# Patient Record
Sex: Male | Born: 1950 | Race: White | Hispanic: No | Marital: Married | Smoking: Former smoker
Health system: Southern US, Community
[De-identification: ages and names within clinical notes are randomized; demographics above are authoritative.]

## PROBLEM LIST (undated history)

## (undated) DIAGNOSIS — N400 Enlarged prostate without lower urinary tract symptoms: Secondary | ICD-10-CM

## (undated) HISTORY — PX: PROSTATE BIOPSY: SHX241

## (undated) HISTORY — DX: Benign prostatic hyperplasia without lower urinary tract symptoms: N40.0

---

## 2005-05-22 DIAGNOSIS — N400 Enlarged prostate without lower urinary tract symptoms: Secondary | ICD-10-CM

## 2005-05-22 HISTORY — DX: Benign prostatic hyperplasia without lower urinary tract symptoms: N40.0

## 2005-05-30 ENCOUNTER — Ambulatory Visit: Payer: Self-pay | Admitting: Internal Medicine

## 2005-06-06 ENCOUNTER — Ambulatory Visit: Payer: Self-pay | Admitting: Internal Medicine

## 2006-07-11 ENCOUNTER — Ambulatory Visit: Payer: Self-pay | Admitting: Internal Medicine

## 2006-07-11 LAB — CONVERTED CEMR LAB
BUN: 13 mg/dL (ref 6–23)
CO2: 30 meq/L (ref 19–32)
Calcium: 9.2 mg/dL (ref 8.4–10.5)
Chloride: 108 meq/L (ref 96–112)
Creatinine, Ser: 0.9 mg/dL (ref 0.4–1.5)
GFR calc Af Amer: 112 mL/min
GFR calc non Af Amer: 93 mL/min
Glucose, Bld: 123 mg/dL — ABNORMAL HIGH (ref 70–99)
Hgb A1c MFr Bld: 5.5 % (ref 4.6–6.0)
Potassium: 4.7 meq/L (ref 3.5–5.1)
Sodium: 142 meq/L (ref 135–145)

## 2006-08-27 ENCOUNTER — Ambulatory Visit: Payer: Self-pay | Admitting: Internal Medicine

## 2006-08-27 LAB — CONVERTED CEMR LAB
PSA, Free Pct: 15 — ABNORMAL LOW (ref >25)
PSA, Free: 0.7 ng/mL
PSA: 4.28 ng/mL — ABNORMAL HIGH (ref 0.10–4.00)
PSA: 4.6 ng/mL — ABNORMAL HIGH (ref 0.10–4.00)

## 2007-01-31 ENCOUNTER — Ambulatory Visit: Payer: Self-pay | Admitting: Internal Medicine

## 2007-02-04 ENCOUNTER — Ambulatory Visit: Payer: Self-pay | Admitting: Internal Medicine

## 2007-05-06 ENCOUNTER — Encounter: Payer: Self-pay | Admitting: Internal Medicine

## 2007-05-21 ENCOUNTER — Telehealth: Payer: Self-pay | Admitting: Internal Medicine

## 2007-06-11 ENCOUNTER — Telehealth: Payer: Self-pay | Admitting: Internal Medicine

## 2007-06-11 ENCOUNTER — Ambulatory Visit: Payer: Self-pay | Admitting: Internal Medicine

## 2007-06-20 LAB — CONVERTED CEMR LAB
ALT: 43 units/L (ref 0–53)
Bilirubin, Direct: 0.2 mg/dL (ref 0.0–0.3)
Calcium: 9.4 mg/dL (ref 8.4–10.5)
Cholesterol: 185 mg/dL (ref 0–200)
Eosinophils Absolute: 0.1 10*3/uL (ref 0.0–0.6)
Eosinophils Relative: 1.8 % (ref 0.0–5.0)
GFR calc Af Amer: 89 mL/min
GFR calc non Af Amer: 73 mL/min
Glucose, Bld: 136 mg/dL — ABNORMAL HIGH (ref 70–99)
Lymphocytes Relative: 30.6 % (ref 12.0–46.0)
MCV: 88.4 fL (ref 78.0–100.0)
Monocytes Relative: 8.3 % (ref 3.0–11.0)
Neutro Abs: 2.4 10*3/uL (ref 1.4–7.7)
Platelets: 169 10*3/uL (ref 150–400)
Potassium: 4.4 meq/L (ref 3.5–5.1)
Sodium: 141 meq/L (ref 135–145)
TSH: 1.26 microintl units/mL (ref 0.35–5.50)
Total CHOL/HDL Ratio: 5.9
Triglycerides: 143 mg/dL (ref 0–149)
Urine Glucose: NEGATIVE mg/dL
WBC: 4 10*3/uL — ABNORMAL LOW (ref 4.5–10.5)

## 2008-04-07 ENCOUNTER — Ambulatory Visit: Payer: Self-pay | Admitting: Internal Medicine

## 2008-04-07 LAB — CONVERTED CEMR LAB
ALT: 17 units/L (ref 0–53)
AST: 19 units/L (ref 0–37)
Albumin: 3.9 g/dL (ref 3.5–5.2)
Alkaline Phosphatase: 54 units/L (ref 39–117)
BUN: 9 mg/dL (ref 6–23)
Bilirubin Urine: NEGATIVE
CO2: 31 meq/L (ref 19–32)
Chloride: 107 meq/L (ref 96–112)
Cholesterol: 133 mg/dL (ref 0–200)
Creatinine, Ser: 0.9 mg/dL (ref 0.4–1.5)
HDL: 40 mg/dL (ref >39.0)
Hgb A1c MFr Bld: 4.9 % (ref 4.6–6.0)
Ketones, ur: NEGATIVE mg/dL
Potassium: 4.5 meq/L (ref 3.5–5.1)
Total Protein, Urine: NEGATIVE mg/dL
Triglycerides: 52 mg/dL (ref 0–149)
Urine Glucose: NEGATIVE mg/dL
pH: 6.5 (ref 5.0–8.0)

## 2008-04-14 ENCOUNTER — Ambulatory Visit: Payer: Self-pay | Admitting: Internal Medicine

## 2008-04-14 DIAGNOSIS — R209 Unspecified disturbances of skin sensation: Secondary | ICD-10-CM | POA: Insufficient documentation

## 2008-04-14 DIAGNOSIS — N400 Enlarged prostate without lower urinary tract symptoms: Secondary | ICD-10-CM | POA: Insufficient documentation

## 2009-07-13 ENCOUNTER — Telehealth (INDEPENDENT_AMBULATORY_CARE_PROVIDER_SITE_OTHER): Payer: Self-pay | Admitting: *Deleted

## 2009-07-15 ENCOUNTER — Ambulatory Visit: Payer: Self-pay | Admitting: Internal Medicine

## 2009-07-16 LAB — CONVERTED CEMR LAB
ALT: 13 units/L (ref 0–53)
AST: 16 units/L (ref 0–37)
Alkaline Phosphatase: 76 units/L (ref 39–117)
BUN: 8 mg/dL (ref 6–23)
Basophils Relative: 1 % (ref 0.0–3.0)
Bilirubin Urine: NEGATIVE
Bilirubin, Direct: 0.2 mg/dL (ref 0.0–0.3)
Chloride: 105 meq/L (ref 96–112)
Creatinine, Ser: 0.8 mg/dL (ref 0.4–1.5)
Eosinophils Relative: 1.1 % (ref 0.0–5.0)
GFR calc non Af Amer: 105.11 mL/min (ref >60)
Ketones, ur: NEGATIVE mg/dL
LDL Cholesterol: 86 mg/dL (ref 0–99)
Lymphocytes Relative: 23.2 % (ref 12.0–46.0)
MCV: 91.1 fL (ref 78.0–100.0)
Monocytes Absolute: 0.4 10*3/uL (ref 0.1–1.0)
Monocytes Relative: 7.3 % (ref 3.0–12.0)
Neutrophils Relative %: 67.4 % (ref 43.0–77.0)
Nitrite: NEGATIVE
PSA: 6.33 ng/mL — ABNORMAL HIGH (ref 0.10–4.00)
Platelets: 188 10*3/uL (ref 150.0–400.0)
RBC: 4.51 M/uL (ref 4.22–5.81)
Specific Gravity, Urine: 1.01 (ref 1.000–1.030)
Total Bilirubin: 0.7 mg/dL (ref 0.3–1.2)
Total CHOL/HDL Ratio: 3
Total Protein, Urine: NEGATIVE mg/dL
Total Protein: 7.3 g/dL (ref 6.0–8.3)
Triglycerides: 62 mg/dL (ref 0.0–149.0)
Urine Glucose: NEGATIVE mg/dL
Urobilinogen, UA: 0.2 (ref 0.0–1.0)
VLDL: 12.4 mg/dL (ref 0.0–40.0)
Vit D, 25-Hydroxy: 15 ng/mL — ABNORMAL LOW (ref 30–89)
WBC: 5.4 10*3/uL (ref 4.5–10.5)
pH: 7.5 (ref 5.0–8.0)

## 2009-07-20 ENCOUNTER — Ambulatory Visit: Payer: Self-pay | Admitting: Internal Medicine

## 2009-07-20 DIAGNOSIS — R972 Elevated prostate specific antigen [PSA]: Secondary | ICD-10-CM

## 2009-07-20 DIAGNOSIS — Z87891 Personal history of nicotine dependence: Secondary | ICD-10-CM

## 2009-07-20 DIAGNOSIS — E559 Vitamin D deficiency, unspecified: Secondary | ICD-10-CM | POA: Insufficient documentation

## 2009-11-15 ENCOUNTER — Ambulatory Visit: Payer: Self-pay | Admitting: Internal Medicine

## 2009-11-15 LAB — CONVERTED CEMR LAB
CO2: 30 meq/L (ref 19–32)
Calcium: 8.9 mg/dL (ref 8.4–10.5)
Creatinine, Ser: 0.9 mg/dL (ref 0.4–1.5)
Hgb A1c MFr Bld: 5.2 % (ref 4.6–6.5)

## 2009-11-16 LAB — CONVERTED CEMR LAB: PSA: 5.56 ng/mL — ABNORMAL HIGH (ref 0.10–4.00)

## 2009-11-17 ENCOUNTER — Encounter: Payer: Self-pay | Admitting: Internal Medicine

## 2009-11-23 ENCOUNTER — Ambulatory Visit: Payer: Self-pay | Admitting: Internal Medicine

## 2009-11-23 DIAGNOSIS — M542 Cervicalgia: Secondary | ICD-10-CM | POA: Insufficient documentation

## 2010-05-27 ENCOUNTER — Ambulatory Visit
Admission: RE | Admit: 2010-05-27 | Discharge: 2010-05-27 | Payer: Self-pay | Source: Home / Self Care | Attending: Internal Medicine | Admitting: Internal Medicine

## 2010-05-27 ENCOUNTER — Encounter: Payer: Self-pay | Admitting: Internal Medicine

## 2010-05-27 ENCOUNTER — Other Ambulatory Visit: Payer: Self-pay | Admitting: Internal Medicine

## 2010-05-27 LAB — URINALYSIS
Bilirubin Urine: NEGATIVE
Hemoglobin, Urine: NEGATIVE
Ketones, ur: NEGATIVE
Leukocytes, UA: NEGATIVE
Nitrite: NEGATIVE
Specific Gravity, Urine: 1.015 (ref 1.000–1.030)
Urine Glucose: NEGATIVE
Urobilinogen, UA: 1 (ref 0.0–1.0)
pH: 7 (ref 5.0–8.0)

## 2010-05-27 LAB — CBC WITH DIFFERENTIAL/PLATELET
Basophils Absolute: 0 10*3/uL (ref 0.0–0.1)
Basophils Relative: 0.4 % (ref 0.0–3.0)
Eosinophils Absolute: 0.1 10*3/uL (ref 0.0–0.7)
Eosinophils Relative: 1.2 % (ref 0.0–5.0)
HCT: 43.6 % (ref 39.0–52.0)
Hemoglobin: 15 g/dL (ref 13.0–17.0)
Lymphocytes Relative: 26 % (ref 12.0–46.0)
Lymphs Abs: 1.4 10*3/uL (ref 0.7–4.0)
MCHC: 34.4 g/dL (ref 30.0–36.0)
MCV: 87.5 fl (ref 78.0–100.0)
Monocytes Absolute: 0.4 10*3/uL (ref 0.1–1.0)
Monocytes Relative: 7.4 % (ref 3.0–12.0)
Neutro Abs: 3.6 10*3/uL (ref 1.4–7.7)
Neutrophils Relative %: 65 % (ref 43.0–77.0)
Platelets: 187 10*3/uL (ref 150.0–400.0)
RBC: 4.98 Mil/uL (ref 4.22–5.81)
RDW: 14 % (ref 11.5–14.6)
WBC: 5.5 10*3/uL (ref 4.5–10.5)

## 2010-05-27 LAB — HEPATIC FUNCTION PANEL
ALT: 19 U/L (ref 0–53)
AST: 21 U/L (ref 0–37)
Albumin: 3.9 g/dL (ref 3.5–5.2)
Alkaline Phosphatase: 87 U/L (ref 39–117)
Bilirubin, Direct: 0.1 mg/dL (ref 0.0–0.3)
Total Bilirubin: 0.8 mg/dL (ref 0.3–1.2)
Total Protein: 6.8 g/dL (ref 6.0–8.3)

## 2010-05-27 LAB — BASIC METABOLIC PANEL
BUN: 12 mg/dL (ref 6–23)
CO2: 29 mEq/L (ref 19–32)
Calcium: 9.1 mg/dL (ref 8.4–10.5)
Chloride: 103 mEq/L (ref 96–112)
Creatinine, Ser: 0.8 mg/dL (ref 0.4–1.5)
GFR: 101.86 mL/min (ref >60.00)
Glucose, Bld: 99 mg/dL (ref 70–99)
Potassium: 4.2 mEq/L (ref 3.5–5.1)
Sodium: 140 mEq/L (ref 135–145)

## 2010-05-27 LAB — LIPID PANEL
Cholesterol: 199 mg/dL (ref 0–200)
HDL: 45.3 mg/dL (ref >39.00)
LDL Cholesterol: 139 mg/dL — ABNORMAL HIGH (ref 0–99)
Total CHOL/HDL Ratio: 4
Triglycerides: 73 mg/dL (ref 0.0–149.0)
VLDL: 14.6 mg/dL (ref 0.0–40.0)

## 2010-05-27 LAB — PSA: PSA: 7.03 ng/mL — ABNORMAL HIGH (ref 0.10–4.00)

## 2010-05-27 LAB — TSH: TSH: 0.88 u[IU]/mL (ref 0.35–5.50)

## 2010-05-28 LAB — CONVERTED CEMR LAB
PSA, Free: 0.9 ng/mL
PSA: 6.96 ng/mL — ABNORMAL HIGH (ref <=4.00)

## 2010-05-31 ENCOUNTER — Ambulatory Visit
Admission: RE | Admit: 2010-05-31 | Discharge: 2010-05-31 | Payer: Self-pay | Source: Home / Self Care | Attending: Internal Medicine | Admitting: Internal Medicine

## 2010-06-21 NOTE — Letter (Signed)
Summary: Results Follow-up Letter  [facility]  [address] [place], [place] [address]   Phone: [phone]  Fax: [phone]    11/17/2009  [address] [place], [place]  [address]  Dear Mr. Abrahamsen,   The following are the results of your recent test(s):  Test     Result     Prostate     high level Vitamin D      low  _________________________________________________________  Please call for an appointment soon _________________________________________________________ _________________________________________________________ _________________________________________________________  Sincerely,  Sanda Linger MD [facility]

## 2010-06-21 NOTE — Assessment & Plan Note (Signed)
Summary: 4 mos f/u // # / cd   Vital Signs:  Patient profile:   60 year old male Height:      69 inches (175.26 cm) Weight:      216 pounds (98.18 kg) BMI:     32.01 Temp:     98.6 degrees F (37.00 degrees C) oral Pulse rate:   68 / minute Resp:     16 per minute BP sitting:   120 / 86  (left arm) Cuff size:   regular  Vitals Entered By: Lanier Prude, CMA(AAMA) (November 23, 2009 7:50 AM) CC: 4 mo f/u Comments pt is not taking ibuprofen    CC:  4 mo f/u.  History of Present Illness: F/u labs, elev PSA, BPH C/o neck pain x wks He had an MRI neck and LS with a relative of his a few months ago - both WNL  Current Medications (verified): 1)  Vitamin D 1000 Unit Tabs (Cholecalciferol) .Marland Kitchen.. 1 By Mouth Qd 2)  Ibuprofen 600 Mg Tabs (Ibuprofen) .Marland Kitchen.. 1 By Mouth Bid  Pc X 1 Wk Then As Needed For  Pain 3)  Vitamin D (Ergocalciferol) 50000 Unit Caps (Ergocalciferol) .Marland Kitchen.. 1 By Mouth Q 1 Week X 6 Weeks Then Start Vit D 1000 International Units Qd 4)  Proscar 5 Mg Tabs (Finasteride) .Marland Kitchen.. 1 By Mouth Once Daily For Prostate  Allergies (verified): No Known Drug Allergies  Past History:  Past Medical History: Last updated: July 22, 2009 Benign prostatic hypertrophy with elev PSA 2007, s/p bx Dr Hazeline Junker D def  Past Surgical History: Last updated: 22-Jul-2009 prostate biopsy Dr Vernie Ammons  Family History: Last updated: 07/22/2009 DM bone cancer M F died at 35  Social History: Last updated: 04/14/2008 Occupation: professor Married Former Smoker Regular exercise-yes  Review of Systems  The patient denies fever, chest pain, dyspnea on exertion, abdominal pain, suspicious skin lesions, difficulty walking, and depression.    Physical Exam  General:  Well-developed,well-nourished,in no acute distress; alert,appropriate and cooperative throughout examination Nose:  External nasal examination shows no deformity or inflammation. Nasal mucosa are pink and moist without lesions or  exudates. Mouth:  Oral mucosa and oropharynx without lesions or exudates.  Teeth in good repair. Lungs:  Normal respiratory effort, chest expands symmetrically. Lungs are clear to auscultation, no crackles or wheezes. Heart:  Normal rate and regular rhythm. S1 and S2 normal without gallop, murmur, click, rub or other extra sounds.Huston Foley. Abdomen:  Bowel sounds positive,abdomen soft and non-tender without masses, organomegaly or hernias noted. Prostate:  Declined Msk:  No deformity or scoliosis noted of thoracic or lumbar spine.   Extremities:  No clubbing, cyanosis, edema, or deformity noted with normal full range of motion of all joints.   Neurologic:  No cranial nerve deficits noted. Station and gait are normal. Plantar reflexes are down-going bilaterally. DTRs are symmetrical throughout. Sensory, motor and coordinative functions appear intact. Skin:  Intact without suspicious lesions or rashes Cracked fingertips Psych:  Cognition and judgment appear intact. Alert and cooperative with normal attention span and concentration. No apparent delusions, illusions, hallucinations   Impression & Recommendations:  Problem # 1:  PSA, INCREASED (ICD-790.93) Assessment New We discussed the issue wit PSA/free PSA He refused exam and Urol f/u ref today. He would rather have me recheck PSA in 6 months  The labs were reviewed with the patient.   Problem # 2:  VITAMIN D DEFICIENCY (ICD-268.9) Assessment: Improved Increase dose Vit D The labs were reviewed with the patient.  Problem # 3:  BENIGN PROSTATIC HYPERTROPHY (ICD-600.00) Assessment: Improved  His updated medication list for this problem includes:    Proscar 5 Mg Tabs (Finasteride) .Marland Kitchen... 1 by mouth once daily for prostate  Problem # 4:  NECK PAIN (ICD-723.1) MSK Assessment: New See "Patient Instructions".  His updated medication list for this problem includes:    Ibuprofen 600 Mg Tabs (Ibuprofen) .Marland Kitchen... 1 by mouth bid  pc x 1 wk then as  needed for  pain    Aspirin 81 Mg Tbec (Aspirin) .Marland Kitchen... 1 by mouth qd  Complete Medication List: 1)  Vitamin D 1000 Unit Tabs (Cholecalciferol) .... 2 by mouth qd 2)  Ibuprofen 600 Mg Tabs (Ibuprofen) .Marland Kitchen.. 1 by mouth bid  pc x 1 wk then as needed for  pain 3)  Proscar 5 Mg Tabs (Finasteride) .Marland Kitchen.. 1 by mouth once daily for prostate 4)  Aspirin 81 Mg Tbec (Aspirin) .Marland Kitchen.. 1 by mouth qd  Patient Instructions: 1)  Please schedule a follow-up appointment in 6 months well w/labs w/PSA and free PSA v70.0 596.0 Vit D 268.9. 2)  Start taking a  yoga class  3)  Get a countour pillow

## 2010-06-21 NOTE — Assessment & Plan Note (Signed)
Summary: CPX LABS / [employer] / # / CD   Vital Signs:  Patient profile:   60 year old male Height:      69 inches Weight:      215 pounds BMI:     31.86 Temp:     97.5 degrees F oral Pulse rate:   61 / minute BP sitting:   110 / 62  (left arm)  Vitals Entered By: Tora Perches (July 20, 2009 2:20 PM) CC: cpx Is Patient Diabetic? No   CC:  cpx.  History of Present Illness: The patient presents for a wellness examination   Preventive Screening-Counseling & Management  Alcohol-Tobacco     Smoking Status: quit  Current Medications (verified): 1)  None  Allergies (verified): No Known Drug Allergies  Past History:  Social History: Last updated: 04/14/2008 Occupation: professor Married Former Smoker Regular exercise-yes  Past Medical History: Benign prostatic hypertrophy with elev PSA 2007, s/p bx Dr Vernie Ammons Vit D def  Past Surgical History: prostate biopsy Dr Vernie Ammons  Family History: DM bone cancer M F died at 94  Review of Systems  The patient denies anorexia, fever, weight loss, weight gain, vision loss, decreased hearing, hoarseness, chest pain, syncope, dyspnea on exertion, peripheral edema, prolonged cough, headaches, hemoptysis, abdominal pain, melena, hematochezia, severe indigestion/heartburn, hematuria, incontinence, genital sores, muscle weakness, suspicious skin lesions, transient blindness, difficulty walking, depression, unusual weight change, abnormal bleeding, enlarged lymph nodes, angioedema, and testicular masses.    Physical Exam  General:  Well-developed,well-nourished,in no acute distress; alert,appropriate and cooperative throughout examination Eyes:  No corneal or conjunctival inflammation noted. EOMI. Perrla. Funduscopic exam benign, without hemorrhages, exudates or papilledema. Vision grossly normal. Ears:  External ear exam shows no significant lesions or deformities.  Otoscopic examination reveals clear canals, tympanic membranes are  intact bilaterally without bulging, retraction, inflammation or discharge. Hearing is grossly normal bilaterally. Nose:  External nasal examination shows no deformity or inflammation. Nasal mucosa are pink and moist without lesions or exudates. Mouth:  Oral mucosa and oropharynx without lesions or exudates.  Teeth in good repair. Lungs:  Normal respiratory effort, chest expands symmetrically. Lungs are clear to auscultation, no crackles or wheezes. Heart:  Normal rate and regular rhythm. S1 and S2 normal without gallop, murmur, click, rub or other extra sounds.Huston Foley. Abdomen:  Bowel sounds positive,abdomen soft and non-tender without masses, organomegaly or hernias noted. Rectal:  external hemorrhoid(s).   Genitalia:  Testes bilaterally descended without nodularity, tenderness or masses. No scrotal masses or lesions. No penis lesions or urethral discharge. Prostate:  no nodules and 1+ enlarged.   Msk:  No deformity or scoliosis noted of thoracic or lumbar spine.   Pulses:  R and L carotid,radial,femoral,dorsalis pedis and posterior tibial pulses are full and equal bilaterally Extremities:  No clubbing, cyanosis, edema, or deformity noted with normal full range of motion of all joints.   Neurologic:  No cranial nerve deficits noted. Station and gait are normal. Plantar reflexes are down-going bilaterally. DTRs are symmetrical throughout. Sensory, motor and coordinative functions appear intact. Skin:  Intact without suspicious lesions or rashes Cracked fingertips Inguinal Nodes:  No significant adenopathy Psych:  Cognition and judgment appear intact. Alert and cooperative with normal attention span and concentration. No apparent delusions, illusions, hallucinations   Impression & Recommendations:  Problem # 1:  ROUTINE GENERAL MEDICAL EXAM@HEALTH  CARE FACL (ICD-V70.0) Assessment New The labs were reviewed with the patient.  Health and age related issues were discussed. Available screening tests  and vaccinations were  discussed as well. Healthy life style including good diet and execise was discussed. Declined colon Orders: EKG w/ Interpretation (93000)  Problem # 2:  PSA, INCREASED (ICD-790.93) Assessment: Deteriorated Options to monitor discussed. Declined Urol f/u. See "Patient Instructions".   Complete Medication List: 1)  Vitamin D 1000 Unit Tabs (Cholecalciferol) .Marland Kitchen.. 1 by mouth qd 2)  Ibuprofen 600 Mg Tabs (Ibuprofen) .Marland Kitchen.. 1 by mouth bid  pc x 1 wk then as needed for  pain 3)  Vitamin D (ergocalciferol) 50000 Unit Caps (Ergocalciferol) .Marland Kitchen.. 1 by mouth q 1 week x 6 weeks then start vit d 1000 international units qd 4)  Proscar 5 Mg Tabs (Finasteride) .Marland Kitchen.. 1 by mouth once daily for prostate  Contraindications/Deferment of Procedures/Staging:    Test/Procedure: Colonoscopy    Reason for deferment: patient declined     Test/Procedure: FLU VAX    Reason for deferment: patient declined   Patient Instructions: 1)  Please schedule a follow-up appointment in 4 months. 2)  BMP prior to visit, ICD-9: 3)  PSA and free PSA prior to visit, ICD-9: 596 4)  Vit D 268.9 5)  HbgA1C prior to visit, ICD-9:790.29 65Prescriptions: PROSCAR 5 MG TABS (FINASTERIDE) 1 by mouth once daily for prostate  #90 x 3   Entered and Authorized by:   Tresa Garter MD   Signed by:   Tresa Garter MD on 07/20/2009   Method used:   Print then Give to Patient   RxID:   [id number] VITAMIN D (ERGOCALCIFEROL) 50000 UNIT CAPS (ERGOCALCIFEROL) 1 by mouth q 1 week x 6 weeks then start Vit D 1000 international units qd  #6 x 0   Entered and Authorized by:   Tresa Garter MD   Signed by:   Tresa Garter MD on 07/20/2009   Method used:   Print then Give to Patient   RxID:   [id number] IBUPROFEN 600 MG TABS (IBUPROFEN) 1 by mouth bid  pc x 1 wk then as needed for  pain  #60 x 3   Entered and Authorized by:   Tresa Garter MD   Signed by:   Tresa Garter MD on  07/20/2009   Method used:   Print then Give to Patient   RxID:   [id number]

## 2010-06-21 NOTE — Progress Notes (Signed)
Summary: Vit D  Phone Note Call from Patient Call back at Home Phone [phone]   Caller: Patient Call For: Tresa Garter MD Summary of Call: Pt called and sched CPX for 3/2. PT is requesting vit d added to CPX labs. Please advise. Initial call taken by: Verdell Face,  July 13, 2009 1:52 PM  Follow-up for Phone Call        OK to add Follow-up by: Tresa Garter MD,  July 14, 2009 12:55 PM  Additional Follow-up for Phone Call Additional follow up Details #1::        Added Vit D to lab order for pt. Additional Follow-up by: Verdell Face,  July 14, 2009 2:02 PM

## 2010-06-23 NOTE — Assessment & Plan Note (Signed)
Summary: 6 MTH FU  STC   Vital Signs:  Patient profile:   60 year old male Height:      69 inches Weight:      220 pounds BMI:     32.61 Temp:     98.5 degrees F oral Pulse rate:   72 / minute Pulse rhythm:   regular Resp:     16 per minute BP sitting:   130 / 92  (left arm) Cuff size:   regular  Vitals Entered By: Lanier Prude, CMA(AAMA) (May 31, 2010 8:05 AM) CC: 6 mo f/u  Is Patient Diabetic? No Comments pt is not taking Ibuprofen, Proscar or Aspirin   CC:  6 mo f/u .  History of Present Illness: The patient presents for a preventive health examination  F/u elev PSA. He stopped Proscar after 6 months of use C/o neck pain  x 23mo worse - he had an MRI neck/LS 1 y ago in [place] --OA  Current Medications (verified): 1)  Vitamin D 1000 Unit Tabs (Cholecalciferol) .... 2 By Mouth Qd 2)  Ibuprofen 600 Mg Tabs (Ibuprofen) .Marland Kitchen.. 1 By Mouth Bid  Pc X 1 Wk Then As Needed For  Pain 3)  Proscar 5 Mg Tabs (Finasteride) .Marland Kitchen.. 1 By Mouth Once Daily For Prostate 4)  Aspirin 81 Mg Tbec (Aspirin) .Marland Kitchen.. 1 By Mouth Qd  Allergies (verified): No Known Drug Allergies  Past History:  Past Medical History: Last updated: 08-01-09 Benign prostatic hypertrophy with elev PSA 2007, s/p bx Dr Hazeline Junker D def  Family History: Last updated: 2009-08-01 DM bone cancer M F died at 66  Past Surgical History: prostate biopsy Dr Vernie Ammons - he did not go  Family History: Reviewed history from 01-Aug-2009 and no changes required. DM bone cancer M F died at 45  Social History: Occupation: professor Married Former Smoker Regular exercise-yes - ping pong and Bikram yoga, walking  Review of Systems  The patient denies anorexia, fever, weight loss, weight gain, vision loss, decreased hearing, hoarseness, chest pain, syncope, dyspnea on exertion, peripheral edema, prolonged cough, headaches, hemoptysis, abdominal pain, melena, hematochezia, severe indigestion/heartburn, hematuria,  incontinence, genital sores, muscle weakness, suspicious skin lesions, transient blindness, difficulty walking, depression, unusual weight change, abnormal bleeding, enlarged lymph nodes, angioedema, and testicular masses.    Physical Exam  General:  Well-developed,well-nourished,in no acute distress; alert,appropriate and cooperative throughout examination Head:  Normocephalic and atraumatic without obvious abnormalities. No apparent alopecia or balding. Eyes:  No corneal or conjunctival inflammation noted. EOMI. Perrla. Funduscopic exam benign, without hemorrhages, exudates or papilledema. Vision grossly normal. Ears:  External ear exam shows no significant lesions or deformities.  Otoscopic examination reveals clear canals, tympanic membranes are intact bilaterally without bulging, retraction, inflammation or discharge. Hearing is grossly normal bilaterally. Nose:  External nasal examination shows no deformity or inflammation. Nasal mucosa are pink and moist without lesions or exudates. Mouth:  Oral mucosa and oropharynx without lesions or exudates.  Teeth in good repair. Neck:  Cervical spine is tender to palpation over paraspinal muscles and with the ROM  Lungs:  Normal respiratory effort, chest expands symmetrically. Lungs are clear to auscultation, no crackles or wheezes. Heart:  Normal rate and regular rhythm. S1 and S2 normal without gallop, murmur, click, rub or other extra sounds.Huston Foley. Abdomen:  Bowel sounds positive,abdomen soft and non-tender without masses, organomegaly or hernias noted. Rectal:  No external abnormalities noted. Normal sphincter tone. No rectal masses or tenderness. Ext tags. G neg stool Genitalia:  Testes  bilaterally descended without nodularity, tenderness or masses. No scrotal masses or lesions. No penis lesions or urethral discharge. Prostate:  no gland enlargement, no nodules, no asymmetry, and 1+ enlarged.   Msk:  No deformity or scoliosis noted of thoracic or  lumbar spine.   Pulses:  R and L carotid,radial,femoral,dorsalis pedis and posterior tibial pulses are full and equal bilaterally Extremities:  WNL Neurologic:  No cranial nerve deficits noted. Station and gait are normal. Plantar reflexes are down-going bilaterally. DTRs are symmetrical throughout. Sensory, motor and coordinative functions appear intact. Skin:  Intact without suspicious lesions or rashes Cervical Nodes:  No lymphadenopathy noted Inguinal Nodes:  No significant adenopathy Psych:  Cognition and judgment appear intact. Alert and cooperative with normal attention span and concentration. No apparent delusions, illusions, hallucinations   Impression & Recommendations:  Problem # 1:  ROUTINE GENERAL MEDICAL EXAM@HEALTH  CARE FACL (ICD-V70.0) Assessment New Discussed vaccines Zostavax info given Orders: Gastroenterology Referral (GI) for colon Health and age related issues were discussed. Available screening tests and vaccinations were discussed as well. Healthy life style including good diet and exercise was discussed.  The labs were reviewed with the patient.   Problem # 2:  NECK PAIN (ICD-723.1) MSK Assessment: Deteriorated See "Patient Instructions".  His updated medication list for this problem includes:    Ibuprofen 600 Mg Tabs (Ibuprofen) .Marland Kitchen... 1 by mouth bid  pc x 1 wk then as needed for  pain    Aspirin 81 Mg Tbec (Aspirin) .Marland Kitchen... 1 by mouth qd  Problem # 3:  BENIGN PROSTATIC HYPERTROPHY (ICD-600.00) Assessment: Unchanged  His updated medication list for this problem includes:    Proscar 5 Mg Tabs (Finasteride) .Marland Kitchen... 1 by mouth once daily  - RESTART  Problem # 4:  PSA, INCREASED (ICD-790.93) Assessment: Deteriorated As per #3 Urol cons is offered Repeat tests  Complete Medication List: 1)  Vitamin D 1000 Unit Tabs (Cholecalciferol) .... 2 by mouth qd 2)  Ibuprofen 600 Mg Tabs (Ibuprofen) .Marland Kitchen.. 1 by mouth bid  pc x 1 wk then as needed for  pain 3)  Proscar 5  Mg Tabs (Finasteride) .Marland Kitchen.. 1 by mouth once daily for prostate 4)  Aspirin 81 Mg Tbec (Aspirin) .Marland Kitchen.. 1 by mouth qd  Patient Instructions: 1)  Please schedule a follow-up appointment in 6 months. 2)  Contour pillow 3)  Neck exercises 4)  BMP prior to visit, ICD-9: 5)  PSA prior to visit, ICD-9: and free PSA 596.0 Prescriptions: PROSCAR 5 MG TABS (FINASTERIDE) 1 by mouth once daily for prostate  #90 x 3   Entered and Authorized by:   Tresa Garter MD   Signed by:   Tresa Garter MD on 05/31/2010   Method used:   Print then Give to Patient   RxID:   [id number] IBUPROFEN 600 MG TABS (IBUPROFEN) 1 by mouth bid  pc x 1 wk then as needed for  pain  #60 x 3   Entered and Authorized by:   Tresa Garter MD   Signed by:   Tresa Garter MD on 05/31/2010   Method used:   Print then Give to Patient   RxID:   [id number]    Orders Added: 1)  Gastroenterology Referral [GI] 2)  Est. Patient age 43-64 [99[address]]    Influenza Vaccine (to be given today)

## 2010-06-28 ENCOUNTER — Encounter (INDEPENDENT_AMBULATORY_CARE_PROVIDER_SITE_OTHER): Payer: Self-pay | Admitting: *Deleted

## 2010-07-04 ENCOUNTER — Encounter (INDEPENDENT_AMBULATORY_CARE_PROVIDER_SITE_OTHER): Payer: Self-pay | Admitting: *Deleted

## 2010-07-05 ENCOUNTER — Encounter: Payer: Self-pay | Admitting: Gastroenterology

## 2010-07-07 NOTE — Letter (Signed)
Summary: Pre Visit Letter Revised  [facility]  [address] [place], [place] [address]   Phone: [phone]  Fax: [phone]        06/28/2010 MRN: [id number] [facility] [address] [place], [place]  [address]             Procedure Date:  07/19/2010 @ 2:30   Direct colon-Dr. Juanda Chance   Welcome to the Gastroenterology Division at [facility].    You are scheduled to see a nurse for your pre-procedure visit on 07/05/2010 at 10:00 on the 3rd floor at [facility], [address]  We ask that you try to arrive at our office 15 minutes prior to your appointment time to allow for check-in.  Please take a minute to review the attached form.  If you answer "Yes" to one or more of the questions on the first page, we ask that you call the person listed at your earliest opportunity.  If you answer "No" to all of the questions, please complete the rest of the form and bring it to your appointment.    Your nurse visit will consist of discussing your medical and surgical history, your immediate family medical history, and your medications.   If you are unable to list all of your medications on the form, please bring the medication bottles to your appointment and we will list them.  We will need to be aware of both prescribed and over the counter drugs.  We will need to know exact dosage information as well.    Please be prepared to read and sign documents such as consent forms, a financial agreement, and acknowledgement forms.  If necessary, and with your consent, a friend or relative is welcome to sit-in on the nurse visit with you.  Please bring your insurance card so that we may make a copy of it.  If your insurance requires a referral to see a specialist, please bring your referral form from your primary care physician.  No co-pay is required for this nurse visit.     If you cannot keep your appointment, please call [phone] to cancel or reschedule  prior to your appointment date.  This allows [place] the opportunity to schedule an appointment for another patient in need of care.    Thank you for choosing [facility] for your medical needs.  We appreciate the opportunity to care for you.  Please visit [place] at our website  to learn more about our practice.  Sincerely, The Gastroenterology Division

## 2010-07-13 NOTE — Letter (Signed)
Summary: [facility] Instructions  [facility]  [address] [place], [place] [address]   Phone: [phone]  Fax: [phone]       Drew Hansen    25-Jul-1950    MRN: [id number]        Procedure Day Dorna Bloom:  Duanne Limerick  07/25/10     Arrival Time:  1:30PM      Procedure Time:  2:30PM     Location of Procedure:                    _ X_  [facility] (4th Floor)                      PREPARATION FOR COLONOSCOPY WITH MOVIPREP   Starting 5 days prior to your procedure 07/20/10 do not eat nuts, seeds, popcorn, corn, beans, peas,  salads, or any raw vegetables.  Do not take any fiber supplements (e.g. Metamucil, Citrucel, and Benefiber).  THE DAY BEFORE YOUR PROCEDURE         DATE: 3/4//12   DAY: SUNDAY  1.  Drink clear liquids the entire day-NO SOLID FOOD  2.  Do not drink anything colored red or purple.  Avoid juices with pulp.  No orange juice.  3.  Drink at least 64 oz. (8 glasses) of fluid/clear liquids during the day to prevent dehydration and help the prep work efficiently.  CLEAR LIQUIDS INCLUDE: Water Jello Ice Popsicles Tea (sugar ok, no milk/cream) Powdered fruit flavored drinks Coffee (sugar ok, no milk/cream) Gatorade Juice: apple, white grape, white cranberry  Lemonade Clear bullion, consomm, broth Carbonated beverages (any kind) Strained chicken noodle soup Hard Candy                             4.  In the morning, mix first dose of MoviPrep solution:    Empty 1 Pouch A and 1 Pouch B into the disposable container    Add lukewarm drinking water to the top line of the container. Mix to dissolve    Refrigerate (mixed solution should be used within 24 hrs)  5.  Begin drinking the prep at 5:00 p.m. The MoviPrep container is divided by 4 marks.   Every 15 minutes drink the solution down to the next mark (approximately 8 oz) until the full liter is complete.   6.  Follow completed prep with 16 oz of clear liquid of your choice  (Nothing red or purple).  Continue to drink clear liquids until bedtime.  7.  Before going to bed, mix second dose of MoviPrep solution:    Empty 1 Pouch A and 1 Pouch B into the disposable container    Add lukewarm drinking water to the top line of the container. Mix to dissolve    Refrigerate  THE DAY OF YOUR PROCEDURE      DATE: 07/25/10   DAY: MONDAY  Beginning at 9:30AM (5 hours before procedure):         1. Every 15 minutes, drink the solution down to the next mark (approx 8 oz) until the full liter is complete.  2. Follow completed prep with 16 oz. of clear liquid of your choice.    3. You may drink clear liquids until 12:30PM (2 HOURS BEFORE PROCEDURE).   MEDICATION INSTRUCTIONS  Unless otherwise instructed, you should take regular prescription medications with a small sip of water   as early as possible the  morning of your procedure.        OTHER INSTRUCTIONS  You will need a responsible adult at least 60 years of age to accompany you and drive you home.   This person must remain in the waiting room during your procedure.  Wear loose fitting clothing that is easily removed.  Leave jewelry and other valuables at home.  However, you may wish to bring a book to read or  an iPod/MP3 player to listen to music as you wait for your procedure to start.  Remove all body piercing jewelry and leave at home.  Total time from sign-in until discharge is approximately 2-3 hours.  You should go home directly after your procedure and rest.  You can resume normal activities the  day after your procedure.  The day of your procedure you should not:   Drive   Make legal decisions   Operate machinery   Drink alcohol   Return to work  You will receive specific instructions about eating, activities and medications before you leave.    The above instructions have been reviewed and explained to me by   Wyona Almas RN  July 05, 2010 10:42 AM     I fully  understand and can verbalize these instructions _____________________________ Date _________

## 2010-07-13 NOTE — Miscellaneous (Signed)
Summary: [facility] Previsit/prep  Clinical Lists Changes  Medications: Added new medication of MOVIPREP 100 GM  SOLR (PEG-KCL-NACL-NASULF-NA ASC-C) As per prep instructions. - Signed Rx of MOVIPREP 100 GM  SOLR (PEG-KCL-NACL-NASULF-NA ASC-C) As per prep instructions.;  #1 x 0;  Signed;  Entered by: Wyona Almas RN;  Authorized by: Mardella Layman MD [facility];  Method used: Electronically to [employer] [facility]. [place] [phone]*, [address] [place], [place], [place]  [address], Ph: [phone] or [phone], Fax: [phone] Observations: Added new observation of NKA: T (07/05/2010 10:05)    Prescriptions: MOVIPREP 100 GM  SOLR (PEG-KCL-NACL-NASULF-NA ASC-C) As per prep instructions.  #1 x 0   Entered by:   Wyona Almas RN   Authorized by:   Mardella Layman MD [facility]   Signed by:   Wyona Almas RN on 07/05/2010   Method used:   Electronically to        [employer] [facility]. [address] [phone]* (retail)       [address]       [place], [place]  [address]       Ph: [phone] or [phone]       Fax: [phone]   RxID:   [id number]

## 2010-07-19 ENCOUNTER — Other Ambulatory Visit: Payer: Self-pay | Admitting: Internal Medicine

## 2010-07-25 ENCOUNTER — Other Ambulatory Visit: Payer: Self-pay | Admitting: Gastroenterology

## 2010-10-07 NOTE — Letter (Signed)
September 05, 2006    Mark C. Vernie Ammons, M.D.  [address]  [place]  [place] [address]   RE:  RANDALL, COLDEN  MRN:  [id number]  /  DOB:  [birth year]   Dear Loraine Leriche:   Thank you for sending me a copy of your letter to Mr. Plancarte.  He  called me to seek an advice in regards to the biopsy you recommended for  his elevated PSA.  I advised him to go ahead with the biopsy; however,  he was still reluctant due to his previous negative experience with a  urologic procedure years ago.  I obtained his PSA with a free PSA.  His  PSA came back at 4.60, free PSA at 0.7, PSA percent 315, which I guess  indicated a probability of prostate cancer present around 24%.  I have  sent him a letter with the lab report and advised him to schedule a  procedure with you.    Sincerely,      Georgina Quint. Plotnikov, MD  Electronically Signed    AVP/MedQ  DD: 09/05/2006  DT: 09/05/2006  Job #: [id number]   CC:    Macarthur Critchley

## 2010-11-16 ENCOUNTER — Other Ambulatory Visit: Payer: Self-pay

## 2010-11-16 ENCOUNTER — Other Ambulatory Visit: Payer: Self-pay | Admitting: Internal Medicine

## 2010-11-16 DIAGNOSIS — N320 Bladder-neck obstruction: Secondary | ICD-10-CM

## 2010-11-18 ENCOUNTER — Other Ambulatory Visit (INDEPENDENT_AMBULATORY_CARE_PROVIDER_SITE_OTHER): Payer: BC Managed Care – PPO

## 2010-11-18 DIAGNOSIS — N320 Bladder-neck obstruction: Secondary | ICD-10-CM

## 2010-11-18 LAB — BASIC METABOLIC PANEL
BUN: 9 mg/dL (ref 6–23)
CO2: 31 mEq/L (ref 19–32)
Calcium: 9.1 mg/dL (ref 8.4–10.5)
Creatinine, Ser: 0.9 mg/dL (ref 0.4–1.5)

## 2010-11-19 LAB — PSA, FREE: PSA, Free: 0.6 ng/mL

## 2010-11-22 ENCOUNTER — Ambulatory Visit (INDEPENDENT_AMBULATORY_CARE_PROVIDER_SITE_OTHER): Payer: BC Managed Care – PPO | Admitting: Internal Medicine

## 2010-11-22 ENCOUNTER — Encounter: Payer: Self-pay | Admitting: Internal Medicine

## 2010-11-22 ENCOUNTER — Telehealth: Payer: Self-pay | Admitting: *Deleted

## 2010-11-22 DIAGNOSIS — R739 Hyperglycemia, unspecified: Secondary | ICD-10-CM

## 2010-11-22 DIAGNOSIS — R972 Elevated prostate specific antigen [PSA]: Secondary | ICD-10-CM

## 2010-11-22 DIAGNOSIS — E559 Vitamin D deficiency, unspecified: Secondary | ICD-10-CM

## 2010-11-22 DIAGNOSIS — N400 Enlarged prostate without lower urinary tract symptoms: Secondary | ICD-10-CM

## 2010-11-22 DIAGNOSIS — M545 Low back pain, unspecified: Secondary | ICD-10-CM | POA: Insufficient documentation

## 2010-11-22 DIAGNOSIS — Z1211 Encounter for screening for malignant neoplasm of colon: Secondary | ICD-10-CM

## 2010-11-22 DIAGNOSIS — R7309 Other abnormal glucose: Secondary | ICD-10-CM

## 2010-11-22 NOTE — Telephone Encounter (Signed)
Pt would like to know why MD did not order labs for his f/u appt in 6 mo. Please advise---do you want labs entered?

## 2010-11-22 NOTE — Progress Notes (Signed)
  Subjective:    Patient ID: Drew Hansen, male    DOB: 08/25/1950, 60 y.o.   MRN: [id number]  HPI  F/u PSA, elev glu C/o muscle aches in the back and shoulders worse w/hard exercise  Review of Systems  Constitutional: Negative for appetite change, fatigue and unexpected weight change (lost wt on diet).  HENT: Negative for nosebleeds, congestion, sore throat, sneezing, trouble swallowing and neck pain.   Eyes: Negative for itching and visual disturbance.  Respiratory: Negative for cough.   Cardiovascular: Negative for chest pain, palpitations and leg swelling.  Gastrointestinal: Negative for nausea, diarrhea, blood in stool and abdominal distention.  Genitourinary: Negative for frequency and hematuria.  Musculoskeletal: Positive for back pain and arthralgias. Negative for joint swelling and gait problem.  Skin: Negative for rash.  Neurological: Negative for dizziness, tremors, speech difficulty and weakness.  Psychiatric/Behavioral: Negative for sleep disturbance, dysphoric mood and agitation. The patient is not nervous/anxious.    Wt Readings from Last 3 Encounters:  11/22/10 209 lb (94.802 kg)  05/31/10 220 lb (99.791 kg)  11/23/09 216 lb (97.977 kg)       Objective:   Physical Exam  Constitutional: He is oriented to person, place, and time. He appears well-developed.  HENT:  Mouth/Throat: Oropharynx is clear and moist.  Eyes: Conjunctivae are normal. Pupils are equal, round, and reactive to light.  Neck: Normal range of motion. No JVD present. No thyromegaly present.  Cardiovascular: Normal rate, regular rhythm, normal heart sounds and intact distal pulses.  Exam reveals no gallop and no friction rub.   No murmur heard. Pulmonary/Chest: Effort normal and breath sounds normal. No respiratory distress. He has no wheezes. He has no rales. He exhibits no tenderness.  Abdominal: Soft. Bowel sounds are normal. He exhibits no distension and no mass. There is no tenderness. There  is no rebound and no guarding.  Musculoskeletal: Normal range of motion. He exhibits tenderness (post chest muscles are tender w/ROM and palp). He exhibits no edema.  Lymphadenopathy:    He has no cervical adenopathy.  Neurological: He is alert and oriented to person, place, and time. He has normal reflexes. He displays normal reflexes. No cranial nerve deficit. He exhibits normal muscle tone. Coordination normal.  Skin: Skin is warm and dry. No rash noted.  Psychiatric: He has a normal mood and affect. His behavior is normal. Judgment and thought content normal.       Lab Results  Component Value Date   WBC 5.5 05/27/2010   HGB 15.0 05/27/2010   HCT 43.6 05/27/2010   PLT 187.0 05/27/2010   CHOL 199 05/27/2010   TRIG 73.0 05/27/2010   HDL 45.30 05/27/2010   ALT 19 05/27/2010   AST 21 05/27/2010   NA 140 11/18/2010   K 4.9 11/18/2010   CL 105 11/18/2010   CREATININE 0.9 11/18/2010   BUN 9 11/18/2010   CO2 31 11/18/2010   TSH 0.88 05/27/2010   PSA 5.90* 11/18/2010   HGBA1C 5.2 11/15/2009      Assessment & Plan:

## 2010-11-22 NOTE — Patient Instructions (Signed)
Wt Readings from Last 3 Encounters:  11/22/10 209 lb (94.802 kg)  05/31/10 220 lb (99.791 kg)  11/23/09 216 lb (97.977 kg)

## 2010-11-22 NOTE — Assessment & Plan Note (Signed)
Monitoring

## 2010-11-22 NOTE — Assessment & Plan Note (Signed)
Re-start Vit D 

## 2010-11-30 DIAGNOSIS — R739 Hyperglycemia, unspecified: Secondary | ICD-10-CM | POA: Insufficient documentation

## 2010-11-30 NOTE — Assessment & Plan Note (Signed)
Will watch 

## 2010-11-30 NOTE — Telephone Encounter (Signed)
Left pt. vm

## 2010-11-30 NOTE — Assessment & Plan Note (Signed)
2010 Free PSA . 25% (risk of prostate ca is 5% on 11/19/10) Will re-check

## 2010-11-30 NOTE — Telephone Encounter (Signed)
Yes - done Thx 

## 2010-11-30 NOTE — Assessment & Plan Note (Signed)
Ibuprof prn

## 2010-11-30 NOTE — Assessment & Plan Note (Signed)
Re-check in 6 mo 

## 2011-02-03 ENCOUNTER — Encounter: Payer: Self-pay | Admitting: Gastroenterology

## 2011-05-26 ENCOUNTER — Other Ambulatory Visit (INDEPENDENT_AMBULATORY_CARE_PROVIDER_SITE_OTHER): Payer: BC Managed Care – PPO

## 2011-05-26 DIAGNOSIS — R739 Hyperglycemia, unspecified: Secondary | ICD-10-CM

## 2011-05-26 DIAGNOSIS — R972 Elevated prostate specific antigen [PSA]: Secondary | ICD-10-CM

## 2011-05-26 DIAGNOSIS — R7309 Other abnormal glucose: Secondary | ICD-10-CM

## 2011-05-26 LAB — BASIC METABOLIC PANEL
BUN: 16 mg/dL (ref 6–23)
CO2: 32 mEq/L (ref 19–32)
Chloride: 105 mEq/L (ref 96–112)
GFR: 88.9 mL/min (ref >60.00)
Glucose, Bld: 92 mg/dL (ref 70–99)
Potassium: 4.8 mEq/L (ref 3.5–5.1)

## 2011-05-30 ENCOUNTER — Encounter: Payer: Self-pay | Admitting: Internal Medicine

## 2011-05-30 ENCOUNTER — Ambulatory Visit (INDEPENDENT_AMBULATORY_CARE_PROVIDER_SITE_OTHER): Payer: BC Managed Care – PPO | Admitting: Internal Medicine

## 2011-05-30 VITALS — BP 120/80 | HR 68 | Temp 97.4°F | Resp 16 | Ht 68.0 in | Wt 225.0 lb

## 2011-05-30 DIAGNOSIS — R972 Elevated prostate specific antigen [PSA]: Secondary | ICD-10-CM

## 2011-05-30 DIAGNOSIS — N400 Enlarged prostate without lower urinary tract symptoms: Secondary | ICD-10-CM

## 2011-05-30 DIAGNOSIS — E559 Vitamin D deficiency, unspecified: Secondary | ICD-10-CM

## 2011-05-30 DIAGNOSIS — Z0000 Encounter for general adult medical examination without abnormal findings: Secondary | ICD-10-CM

## 2011-05-30 NOTE — Assessment & Plan Note (Signed)
Continue with current prescription therapy as reflected on the Med list.  

## 2011-05-30 NOTE — Progress Notes (Signed)
  Subjective:    Patient ID: Drew Hansen, male    DOB: 1950-10-10, 61 y.o.   MRN: [id number]  HPI  F/u elev PSA, BPH   Review of Systems  Constitutional: Negative for appetite change, fatigue and unexpected weight change.  HENT: Negative for nosebleeds, congestion, sore throat, sneezing, trouble swallowing and neck pain.   Eyes: Negative for itching and visual disturbance.  Respiratory: Negative for cough.   Cardiovascular: Negative for chest pain, palpitations and leg swelling.  Gastrointestinal: Negative for nausea, diarrhea, blood in stool and abdominal distention.  Genitourinary: Negative for frequency and hematuria.  Musculoskeletal: Negative for back pain, joint swelling and gait problem.  Skin: Negative for rash.  Neurological: Negative for dizziness, tremors, speech difficulty and weakness.  Psychiatric/Behavioral: Negative for sleep disturbance, dysphoric mood and agitation. The patient is not nervous/anxious.        Objective:   Physical Exam  Constitutional: He is oriented to person, place, and time. He appears well-developed.  HENT:  Mouth/Throat: Oropharynx is clear and moist.  Eyes: Conjunctivae are normal. Pupils are equal, round, and reactive to light.  Neck: Normal range of motion. No JVD present. No thyromegaly present.  Cardiovascular: Normal rate, regular rhythm, normal heart sounds and intact distal pulses.  Exam reveals no gallop and no friction rub.   No murmur heard. Pulmonary/Chest: Effort normal and breath sounds normal. No respiratory distress. He has no wheezes. He has no rales. He exhibits no tenderness.  Abdominal: Soft. Bowel sounds are normal. He exhibits no distension and no mass. There is no tenderness. There is no rebound and no guarding.  Musculoskeletal: Normal range of motion. He exhibits no edema and no tenderness.  Lymphadenopathy:    He has no cervical adenopathy.  Neurological: He is alert and oriented to person, place, and time. He  has normal reflexes. No cranial nerve deficit. He exhibits normal muscle tone. Coordination normal.  Skin: Skin is warm and dry. No rash noted.  Psychiatric: He has a normal mood and affect. His behavior is normal. Judgment and thought content normal.    Lab Results  Component Value Date   WBC 5.5 05/27/2010   HGB 15.0 05/27/2010   HCT 43.6 05/27/2010   PLT 187.0 05/27/2010   GLUCOSE 92 05/26/2011   CHOL 199 05/27/2010   TRIG 73.0 05/27/2010   HDL 45.30 05/27/2010   LDLCALC 139* 05/27/2010   ALT 19 05/27/2010   AST 21 05/27/2010   NA 141 05/26/2011   K 4.8 05/26/2011   CL 105 05/26/2011   CREATININE 0.9 05/26/2011   BUN 16 05/26/2011   CO2 32 05/26/2011   TSH 0.88 05/27/2010   PSA 4.25* 05/26/2011   HGBA1C 5.2 11/15/2009         Assessment & Plan:

## 2011-05-30 NOTE — Assessment & Plan Note (Signed)
Discussed Recheck in 6 mo

## 2011-05-30 NOTE — Assessment & Plan Note (Signed)
Labs reviewed Continue with current prescription therapy as reflected on the Med list. PSA is down

## 2011-07-07 ENCOUNTER — Other Ambulatory Visit: Payer: Self-pay | Admitting: *Deleted

## 2011-07-07 MED ORDER — FINASTERIDE 5 MG PO TABS: 5.0000 mg | ORAL_TABLET | Freq: Every day | ORAL | Status: DC

## 2011-11-27 ENCOUNTER — Ambulatory Visit: Payer: BC Managed Care – PPO | Admitting: Internal Medicine

## 2012-04-23 ENCOUNTER — Ambulatory Visit: Payer: BC Managed Care – PPO | Admitting: Internal Medicine

## 2012-04-23 DIAGNOSIS — Z0289 Encounter for other administrative examinations: Secondary | ICD-10-CM

## 2012-09-24 ENCOUNTER — Other Ambulatory Visit (INDEPENDENT_AMBULATORY_CARE_PROVIDER_SITE_OTHER): Payer: BC Managed Care – PPO

## 2012-09-24 ENCOUNTER — Encounter: Payer: Self-pay | Admitting: Gastroenterology

## 2012-09-24 ENCOUNTER — Other Ambulatory Visit: Payer: Self-pay | Admitting: *Deleted

## 2012-09-24 DIAGNOSIS — Z0000 Encounter for general adult medical examination without abnormal findings: Secondary | ICD-10-CM

## 2012-09-24 DIAGNOSIS — R972 Elevated prostate specific antigen [PSA]: Secondary | ICD-10-CM

## 2012-09-24 LAB — URINALYSIS, ROUTINE W REFLEX MICROSCOPIC
Bilirubin Urine: NEGATIVE
Ketones, ur: NEGATIVE
Leukocytes, UA: NEGATIVE
Specific Gravity, Urine: 1.015 (ref 1.000–1.030)
Total Protein, Urine: NEGATIVE
pH: 7 (ref 5.0–8.0)

## 2012-09-24 LAB — CBC WITH DIFFERENTIAL/PLATELET
Basophils Absolute: 0 10*3/uL (ref 0.0–0.1)
Eosinophils Absolute: 0.1 10*3/uL (ref 0.0–0.7)
Lymphocytes Relative: 30.8 % (ref 12.0–46.0)
MCHC: 35.1 g/dL (ref 30.0–36.0)
MCV: 87.9 fl (ref 78.0–100.0)
Monocytes Absolute: 0.5 10*3/uL (ref 0.1–1.0)
Neutrophils Relative %: 57.9 % (ref 43.0–77.0)
Platelets: 163 10*3/uL (ref 150.0–400.0)
RBC: 5.1 Mil/uL (ref 4.22–5.81)
RDW: 13.1 % (ref 11.5–14.6)

## 2012-09-25 LAB — LIPID PANEL
Cholesterol: 189 mg/dL (ref 0–200)
HDL: 45.1 mg/dL (ref >39.00)
Total CHOL/HDL Ratio: 4
Triglycerides: 118 mg/dL (ref 0.0–149.0)

## 2012-09-25 LAB — COMPREHENSIVE METABOLIC PANEL
ALT: 23 U/L (ref 0–53)
AST: 24 U/L (ref 0–37)
CO2: 31 mEq/L (ref 19–32)
Chloride: 104 mEq/L (ref 96–112)
Creatinine, Ser: 0.9 mg/dL (ref 0.4–1.5)
GFR: 89.63 mL/min (ref >60.00)
Sodium: 138 mEq/L (ref 135–145)
Total Bilirubin: 0.9 mg/dL (ref 0.3–1.2)
Total Protein: 7.2 g/dL (ref 6.0–8.3)

## 2012-09-25 LAB — PSA, TOTAL AND FREE: PSA, Free Pct: 14 % — ABNORMAL LOW (ref >25)

## 2012-10-15 ENCOUNTER — Encounter: Payer: Self-pay | Admitting: Internal Medicine

## 2012-10-15 ENCOUNTER — Ambulatory Visit (INDEPENDENT_AMBULATORY_CARE_PROVIDER_SITE_OTHER): Payer: BC Managed Care – PPO | Admitting: Internal Medicine

## 2012-10-15 VITALS — BP 120/80 | HR 80 | Temp 99.0°F | Resp 16 | Wt 219.0 lb

## 2012-10-15 DIAGNOSIS — N400 Enlarged prostate without lower urinary tract symptoms: Secondary | ICD-10-CM

## 2012-10-15 DIAGNOSIS — Z23 Encounter for immunization: Secondary | ICD-10-CM

## 2012-10-15 DIAGNOSIS — R972 Elevated prostate specific antigen [PSA]: Secondary | ICD-10-CM

## 2012-10-15 NOTE — Assessment & Plan Note (Addendum)
2014 free PSA is 14% (risk 33.9%) off Rx- discussed

## 2012-10-15 NOTE — Assessment & Plan Note (Addendum)
Chronic s/p bx He has been refusing Urol f/u 2010 Free PSA 25% (risk of prostate ca is 5% on 11/19/10) 2014 free PSA is 14% (risk 33.9%) - he is off Rx

## 2012-10-15 NOTE — Progress Notes (Signed)
Patient ID: Drew Hansen, male   DOB: 09-04-1950, 62 y.o.   MRN: [id number]  Subjective:    Patient ID: Drew Hansen, male    DOB: November 11, 1950, 62 y.o.   MRN: [id number]  HPI  F/u elev PSA, BPH   Review of Systems  Constitutional: Negative for appetite change, fatigue and unexpected weight change.  HENT: Negative for nosebleeds, congestion, sore throat, sneezing, trouble swallowing and neck pain.   Eyes: Negative for itching and visual disturbance.  Respiratory: Negative for cough.   Cardiovascular: Negative for chest pain, palpitations and leg swelling.  Gastrointestinal: Negative for nausea, diarrhea, blood in stool and abdominal distention.  Genitourinary: Negative for frequency and hematuria.  Musculoskeletal: Negative for back pain, joint swelling and gait problem.  Skin: Negative for rash.  Neurological: Negative for dizziness, tremors, speech difficulty and weakness.  Psychiatric/Behavioral: Negative for sleep disturbance, dysphoric mood and agitation. The patient is not nervous/anxious.        Objective:   Physical Exam  Constitutional: He is oriented to person, place, and time. He appears well-developed.  HENT:  Mouth/Throat: Oropharynx is clear and moist.  Eyes: Conjunctivae are normal. Pupils are equal, round, and reactive to light.  Neck: Normal range of motion. No JVD present. No thyromegaly present.  Cardiovascular: Normal rate, regular rhythm, normal heart sounds and intact distal pulses.  Exam reveals no gallop and no friction rub.   No murmur heard. Pulmonary/Chest: Effort normal and breath sounds normal. No respiratory distress. He has no wheezes. He has no rales. He exhibits no tenderness.  Abdominal: Soft. Bowel sounds are normal. He exhibits no distension and no mass. There is no tenderness. There is no rebound and no guarding.  Musculoskeletal: Normal range of motion. He exhibits no edema and no tenderness.  Lymphadenopathy:    He has no cervical  adenopathy.  Neurological: He is alert and oriented to person, place, and time. He has normal reflexes. No cranial nerve deficit. He exhibits normal muscle tone. Coordination normal.  Skin: Skin is warm and dry. No rash noted.  Psychiatric: He has a normal mood and affect. His behavior is normal. Judgment and thought content normal.    Lab Results  Component Value Date   WBC 5.1 09/24/2012   HGB 15.8 09/24/2012   HCT 44.9 09/24/2012   PLT 163.0 09/24/2012   GLUCOSE 97 09/24/2012   CHOL 189 09/24/2012   TRIG 118.0 09/24/2012   HDL 45.10 09/24/2012   LDLCALC 120* 09/24/2012   ALT 23 09/24/2012   AST 24 09/24/2012   NA 138 09/24/2012   K 4.4 09/24/2012   CL 104 09/24/2012   CREATININE 0.9 09/24/2012   BUN 11 09/24/2012   CO2 31 09/24/2012   TSH 1.21 09/24/2012   PSA 7.38* 09/24/2012   HGBA1C 5.2 11/15/2009         Assessment & Plan:

## 2012-10-18 ENCOUNTER — Encounter: Payer: Self-pay | Admitting: Gastroenterology

## 2012-10-22 ENCOUNTER — Ambulatory Visit: Payer: BC Managed Care – PPO | Admitting: Internal Medicine

## 2013-11-07 ENCOUNTER — Ambulatory Visit (INDEPENDENT_AMBULATORY_CARE_PROVIDER_SITE_OTHER): Payer: BC Managed Care – PPO | Admitting: Internal Medicine

## 2013-11-07 ENCOUNTER — Other Ambulatory Visit (INDEPENDENT_AMBULATORY_CARE_PROVIDER_SITE_OTHER): Payer: BC Managed Care – PPO

## 2013-11-07 ENCOUNTER — Ambulatory Visit (INDEPENDENT_AMBULATORY_CARE_PROVIDER_SITE_OTHER)
Admission: RE | Admit: 2013-11-07 | Discharge: 2013-11-07 | Disposition: A | Discharge status: Home / Self Care | Payer: BC Managed Care – PPO | Source: Ambulatory Visit | Attending: Internal Medicine | Admitting: Internal Medicine

## 2013-11-07 ENCOUNTER — Encounter: Payer: Self-pay | Admitting: Internal Medicine

## 2013-11-07 VITALS — BP 120/80 | HR 76 | Temp 97.6°F | Resp 16 | Ht 68.0 in | Wt 234.0 lb

## 2013-11-07 DIAGNOSIS — N400 Enlarged prostate without lower urinary tract symptoms: Secondary | ICD-10-CM

## 2013-11-07 DIAGNOSIS — Z0000 Encounter for general adult medical examination without abnormal findings: Secondary | ICD-10-CM | POA: Insufficient documentation

## 2013-11-07 DIAGNOSIS — M545 Low back pain, unspecified: Secondary | ICD-10-CM

## 2013-11-07 DIAGNOSIS — M79604 Pain in right leg: Secondary | ICD-10-CM

## 2013-11-07 DIAGNOSIS — E559 Vitamin D deficiency, unspecified: Secondary | ICD-10-CM

## 2013-11-07 DIAGNOSIS — R972 Elevated prostate specific antigen [PSA]: Secondary | ICD-10-CM

## 2013-11-07 LAB — LIPID PANEL
CHOL/HDL RATIO: 4
Cholesterol: 182 mg/dL (ref 0–200)
HDL: 43.2 mg/dL (ref >39.00)
LDL CALC: 116 mg/dL — AB (ref 0–99)
NonHDL: 138.8
Triglycerides: 116 mg/dL (ref 0.0–149.0)
VLDL: 23.2 mg/dL (ref 0.0–40.0)

## 2013-11-07 LAB — CBC WITH DIFFERENTIAL/PLATELET
BASOS ABS: 0 10*3/uL (ref 0.0–0.1)
BASOS PCT: 0.4 % (ref 0.0–3.0)
Eosinophils Absolute: 0.1 10*3/uL (ref 0.0–0.7)
Eosinophils Relative: 1.8 % (ref 0.0–5.0)
HEMATOCRIT: 43.5 % (ref 39.0–52.0)
HEMOGLOBIN: 14.9 g/dL (ref 13.0–17.0)
LYMPHS ABS: 1.4 10*3/uL (ref 0.7–4.0)
Lymphocytes Relative: 22.7 % (ref 12.0–46.0)
MCHC: 34.2 g/dL (ref 30.0–36.0)
MCV: 88.9 fl (ref 78.0–100.0)
MONOS PCT: 6.1 % (ref 3.0–12.0)
Monocytes Absolute: 0.4 10*3/uL (ref 0.1–1.0)
NEUTROS ABS: 4.2 10*3/uL (ref 1.4–7.7)
Neutrophils Relative %: 69 % (ref 43.0–77.0)
Platelets: 179 10*3/uL (ref 150.0–400.0)
RBC: 4.89 Mil/uL (ref 4.22–5.81)
RDW: 13.1 % (ref 11.5–15.5)
WBC: 6.1 10*3/uL (ref 4.0–10.5)

## 2013-11-07 LAB — URINALYSIS
Bilirubin Urine: NEGATIVE
Hgb urine dipstick: NEGATIVE
KETONES UR: NEGATIVE
Leukocytes, UA: NEGATIVE
Nitrite: NEGATIVE
PH: 6 (ref 5.0–8.0)
Specific Gravity, Urine: 1.025 (ref 1.000–1.030)
URINE GLUCOSE: NEGATIVE
Urobilinogen, UA: 0.2 (ref 0.0–1.0)

## 2013-11-07 LAB — BASIC METABOLIC PANEL
BUN: 11 mg/dL (ref 6–23)
CALCIUM: 9.2 mg/dL (ref 8.4–10.5)
CO2: 27 mEq/L (ref 19–32)
Chloride: 109 mEq/L (ref 96–112)
Creatinine, Ser: 0.8 mg/dL (ref 0.4–1.5)
GFR: 99.31 mL/min (ref >60.00)
GLUCOSE: 111 mg/dL — AB (ref 70–99)
POTASSIUM: 4.2 meq/L (ref 3.5–5.1)
SODIUM: 141 meq/L (ref 135–145)

## 2013-11-07 LAB — HEPATIC FUNCTION PANEL
ALK PHOS: 59 U/L (ref 39–117)
ALT: 26 U/L (ref 0–53)
AST: 21 U/L (ref 0–37)
Albumin: 4.2 g/dL (ref 3.5–5.2)
BILIRUBIN DIRECT: 0.1 mg/dL (ref 0.0–0.3)
BILIRUBIN TOTAL: 0.9 mg/dL (ref 0.2–1.2)
Total Protein: 7 g/dL (ref 6.0–8.3)

## 2013-11-07 LAB — TSH: TSH: 1.22 u[IU]/mL (ref 0.35–4.50)

## 2013-11-07 NOTE — Progress Notes (Signed)
Pre visit review using our clinic review tool, if applicable. No additional management support is needed unless otherwise documented below in the visit note. 

## 2013-11-07 NOTE — Assessment & Plan Note (Signed)
2015 elev PSA, BPH -  Dr Ottelin, Urology - exam, PSA q 12 mo 

## 2013-11-07 NOTE — Assessment & Plan Note (Signed)
2015 elev PSA, BPH -  Dr Vernie Ammonsttelin, Urology - exam, PSA q 12 mo

## 2013-11-07 NOTE — Assessment & Plan Note (Signed)
Xray   PT

## 2013-11-07 NOTE — Patient Instructions (Addendum)
??   Meralgia paresthetica  Cologuard  Restorative yoga, stretch

## 2013-11-07 NOTE — Assessment & Plan Note (Signed)
We discussed age appropriate health related issues, including available/recomended screening tests and vaccinations. We discussed a need for adhering to healthy diet and exercise. Labs/EKG were reviewed/ordered. All questions were answered. Cologuard 

## 2013-11-07 NOTE — Progress Notes (Signed)
   Subjective:    HPI  The patient is here for a wellness exam. The patient has been doing well overall without major physical or psychological issues going on lately.  C/o LBP and RLE pain x months (recurrent) [place] in [place] 5 years [place] - ?disk  F/u elev PSA, BPH - seeing Urology - exam, PSA q 12 mo   Review of Systems  Constitutional: Negative for appetite change, fatigue and unexpected weight change.  HENT: Negative for congestion, nosebleeds, sneezing, sore throat and trouble swallowing.   Eyes: Negative for itching and visual disturbance.  Respiratory: Negative for cough.   Cardiovascular: Negative for chest pain, palpitations and leg swelling.  Gastrointestinal: Negative for nausea, diarrhea, blood in stool and abdominal distention.  Genitourinary: Negative for frequency and hematuria.  Musculoskeletal: Negative for back pain, gait problem, joint swelling and neck pain.  Skin: Negative for rash.  Neurological: Negative for dizziness, tremors, speech difficulty and weakness.  Psychiatric/Behavioral: Negative for sleep disturbance, dysphoric mood and agitation. The patient is not nervous/anxious.        Objective:   Physical Exam  Constitutional: He is oriented to person, place, and time. He appears well-developed.  HENT:  Mouth/Throat: Oropharynx is clear and moist.  Eyes: Conjunctivae are normal. Pupils are equal, round, and reactive to light.  Neck: Normal range of motion. No JVD present. No thyromegaly present.  Cardiovascular: Normal rate, regular rhythm, normal heart sounds and intact distal pulses.  Exam reveals no gallop and no friction rub.   No murmur heard. Pulmonary/Chest: Effort normal and breath sounds normal. No respiratory distress. He has no wheezes. He has no rales. He exhibits no tenderness.  Abdominal: Soft. Bowel sounds are normal. He exhibits no distension and no mass. There is no tenderness. There is no rebound and no guarding.   Musculoskeletal: Normal range of motion. He exhibits no edema and no tenderness.  Lymphadenopathy:    He has no cervical adenopathy.  Neurological: He is alert and oriented to person, place, and time. He has normal reflexes. No cranial nerve deficit. He exhibits normal muscle tone. Coordination normal.  Skin: Skin is warm and dry. No rash noted.  Psychiatric: He has a normal mood and affect. His behavior is normal. Judgment and thought content normal.    Lab Results  Component Value Date   WBC 5.1 09/24/2012   HGB 15.8 09/24/2012   HCT 44.9 09/24/2012   PLT 163.0 09/24/2012   GLUCOSE 97 09/24/2012   CHOL 189 09/24/2012   TRIG 118.0 09/24/2012   HDL 45.10 09/24/2012   LDLCALC 120* 09/24/2012   ALT 23 09/24/2012   AST 24 09/24/2012   NA 138 09/24/2012   K 4.4 09/24/2012   CL 104 09/24/2012   CREATININE 0.9 09/24/2012   BUN 11 09/24/2012   CO2 31 09/24/2012   TSH 1.21 09/24/2012   PSA 7.38* 09/24/2012   HGBA1C 5.2 11/15/2009         Assessment & Plan:

## 2013-11-07 NOTE — Assessment & Plan Note (Signed)
Vit D 

## 2013-11-13 LAB — COLOGUARD: Cologuard: NEGATIVE

## 2013-11-19 ENCOUNTER — Telehealth: Payer: Self-pay | Admitting: *Deleted

## 2013-11-19 NOTE — Telephone Encounter (Signed)
Left msg on triage requesting call abck. Called pt back he is wanting to get the films of the xray he had done of his spine. Inform pt he has to go to the medical records dept to request films...Raechel Chute/lmb

## 2013-11-27 ENCOUNTER — Telehealth: Payer: Self-pay | Admitting: *Deleted

## 2013-11-27 NOTE — Telephone Encounter (Signed)
I left a detailed mess informing pt of his 11/13/13 Negative Cologaurd results.

## 2013-12-05 ENCOUNTER — Encounter: Payer: Self-pay | Admitting: Internal Medicine

## 2014-03-27 ENCOUNTER — Telehealth: Payer: Self-pay | Admitting: Internal Medicine

## 2014-03-27 NOTE — Telephone Encounter (Signed)
[facility]. His insurance company is requesting a letter of medical necessity? CB# [phone]

## 2014-03-27 NOTE — Telephone Encounter (Signed)
Insurance states Cologaurd to be "investigational" and it did not meet the medical necessity coverage of plan. Pt is asking that we submit an appeal letter to:   [facility] [employer] [place] Claims Review and payment Dept [address]  [place], [place] [address]  Phone: [phone] Fax: [phone]

## 2014-03-28 NOTE — Telephone Encounter (Signed)
Misty StanleyStacey, please, contact Cologuard rep re: a letter sample Thx

## 2014-04-13 NOTE — Telephone Encounter (Signed)
I called [employer] and spoke to a rep: An appeal was completed and submitted 03/19/14. No response has been received as of yet. The appeal process make take up to 30-45 days. Representative states it may help if we submit OV notes and/or letter of medical necessity.

## 2014-04-14 NOTE — Telephone Encounter (Signed)
Drew Hansen, Drew Hansen, inform patient that we are working on it [facility]

## 2014-04-30 NOTE — Telephone Encounter (Signed)
I called [employer] and spoke to [place]- she states she has no record of a reply back from appeal dept re: Cologaurd Screening. Left detailed mess informing pt of this and advising him to contact [employer] appeal dept at number below to see if we missed anything.

## 2014-06-24 ENCOUNTER — Ambulatory Visit (INDEPENDENT_AMBULATORY_CARE_PROVIDER_SITE_OTHER)
Admission: RE | Admit: 2014-06-24 | Discharge: 2014-06-24 | Disposition: A | Discharge status: Home / Self Care | Payer: BC Managed Care – PPO | Source: Ambulatory Visit | Attending: Internal Medicine | Admitting: Internal Medicine

## 2014-06-24 ENCOUNTER — Ambulatory Visit (INDEPENDENT_AMBULATORY_CARE_PROVIDER_SITE_OTHER): Payer: BC Managed Care – PPO | Admitting: Internal Medicine

## 2014-06-24 ENCOUNTER — Encounter: Payer: Self-pay | Admitting: Internal Medicine

## 2014-06-24 VITALS — BP 128/76 | HR 85 | Temp 98.5°F | Wt 243.0 lb

## 2014-06-24 DIAGNOSIS — M5441 Lumbago with sciatica, right side: Secondary | ICD-10-CM

## 2014-06-24 DIAGNOSIS — J189 Pneumonia, unspecified organism: Secondary | ICD-10-CM

## 2014-06-24 DIAGNOSIS — J069 Acute upper respiratory infection, unspecified: Secondary | ICD-10-CM

## 2014-06-24 DIAGNOSIS — J181 Lobar pneumonia, unspecified organism: Secondary | ICD-10-CM

## 2014-06-24 DIAGNOSIS — M5442 Lumbago with sciatica, left side: Secondary | ICD-10-CM

## 2014-06-24 MED ORDER — LEVOFLOXACIN 500 MG PO TABS: 500.0000 mg | ORAL_TABLET | Freq: Every day | ORAL | Status: DC

## 2014-06-24 MED ORDER — PROMETHAZINE-CODEINE 6.25-10 MG/5ML PO SYRP: 5.0000 mL | ORAL_SOLUTION | ORAL | Status: DC | PRN

## 2014-06-24 MED ORDER — BENZONATATE 200 MG PO CAPS: 200.0000 mg | ORAL_CAPSULE | Freq: Two times a day (BID) | ORAL | Status: DC | PRN

## 2014-06-24 NOTE — Progress Notes (Signed)
   Subjective:    HPI  C/o URI, cough x 1 mo C/o LBP irrad in the R thigh - burning pain w/ROM   Review of Systems  Constitutional: Negative for appetite change, fatigue and unexpected weight change.  HENT: Negative for congestion, nosebleeds, sneezing, sore throat and trouble swallowing.   Eyes: Negative for itching and visual disturbance.  Respiratory: Negative for cough.   Cardiovascular: Negative for chest pain, palpitations and leg swelling.  Gastrointestinal: Negative for nausea, diarrhea, blood in stool and abdominal distention.  Genitourinary: Negative for frequency and hematuria.  Musculoskeletal: Negative for back pain, joint swelling, gait problem and neck pain.  Skin: Negative for rash.  Neurological: Negative for dizziness, tremors, speech difficulty and weakness.  Psychiatric/Behavioral: Negative for sleep disturbance, dysphoric mood and agitation. The patient is not nervous/anxious.        Objective:   Physical Exam  Constitutional: He is oriented to person, place, and time. He appears well-developed.  HENT:  Mouth/Throat: Oropharynx is clear and moist.  Eyes: Conjunctivae are normal. Pupils are equal, round, and reactive to light.  Neck: Normal range of motion. No JVD present. No thyromegaly present.  Cardiovascular: Normal rate, regular rhythm, normal heart sounds and intact distal pulses.  Exam reveals no gallop and no friction rub.   No murmur heard. Pulmonary/Chest: Effort normal and breath sounds normal. No respiratory distress. He has no wheezes. He has no rales. He exhibits no tenderness.  Abdominal: Soft. Bowel sounds are normal. He exhibits no distension and no mass. There is no tenderness. There is no rebound and no guarding.  Musculoskeletal: Normal range of motion. He exhibits no edema or tenderness.  Lymphadenopathy:    He has no cervical adenopathy.  Neurological: He is alert and oriented to person, place, and time. He has normal reflexes. No  cranial nerve deficit. He exhibits normal muscle tone. Coordination normal.  Skin: Skin is warm and dry. No rash noted.  Psychiatric: He has a normal mood and affect. His behavior is normal. Judgment and thought content normal.  L base rhonchi Str leg elev (-) B B hips ROM OK MS ok  Lab Results  Component Value Date   WBC 6.1 11/07/2013   HGB 14.9 11/07/2013   HCT 43.5 11/07/2013   PLT 179.0 11/07/2013   GLUCOSE 111* 11/07/2013   CHOL 182 11/07/2013   TRIG 116.0 11/07/2013   HDL 43.20 11/07/2013   LDLCALC 116* 11/07/2013   ALT 26 11/07/2013   AST 21 11/07/2013   NA 141 11/07/2013   K 4.2 11/07/2013   CL 109 11/07/2013   CREATININE 0.8 11/07/2013   BUN 11 11/07/2013   CO2 27 11/07/2013   TSH 1.22 11/07/2013   PSA 7.38* 09/24/2012   HGBA1C 5.2 11/15/2009         Assessment & Plan:  Patient ID: Drew Hansen, male   DOB: 08/24/1950, 64 y.o.   MRN: [id number]

## 2014-06-24 NOTE — Progress Notes (Signed)
Pre visit review using our clinic review tool, if applicable. No additional management support is needed unless otherwise documented below in the visit note. 

## 2014-06-24 NOTE — Assessment & Plan Note (Addendum)
He had an MRI (whole spine) at [facility] 2-3 years ago - small herniation MSK pains 2/16 worse R/o radiculopathy, R hip disease , etc Sports med ref Dr Katrinka BlazingSmith

## 2014-06-24 NOTE — Assessment & Plan Note (Signed)
2/16 r/o PNA LLL  CXR Levaquin x 10 d Prom-cod syr

## 2014-06-25 ENCOUNTER — Telehealth: Payer: Self-pay | Admitting: *Deleted

## 2014-06-25 MED ORDER — CLARITHROMYCIN 500 MG PO TABS: 500.0000 mg | ORAL_TABLET | Freq: Two times a day (BID) | ORAL | Status: DC

## 2014-06-25 NOTE — Telephone Encounter (Signed)
Pt informed

## 2014-06-25 NOTE — Telephone Encounter (Signed)
Sorry: changed Rx to Biaxin Pls call pt Thx

## 2014-06-25 NOTE — Telephone Encounter (Signed)
[facility] calling stating pt is highly allergic to Cipro. They want to know if a different abx can be sent in place of Levaquin. Please advise

## 2014-07-03 ENCOUNTER — Ambulatory Visit (INDEPENDENT_AMBULATORY_CARE_PROVIDER_SITE_OTHER): Payer: BC Managed Care – PPO | Admitting: Family Medicine

## 2014-07-03 ENCOUNTER — Encounter: Payer: Self-pay | Admitting: Family Medicine

## 2014-07-03 VITALS — BP 126/80 | HR 75 | Ht 68.0 in | Wt 247.0 lb

## 2014-07-03 DIAGNOSIS — M24851 Other specific joint derangements of right hip, not elsewhere classified: Secondary | ICD-10-CM

## 2014-07-03 DIAGNOSIS — M24859 Other specific joint derangements of unspecified hip, not elsewhere classified: Secondary | ICD-10-CM | POA: Insufficient documentation

## 2014-07-03 MED ORDER — MELOXICAM 15 MG PO TABS: 15.0000 mg | ORAL_TABLET | Freq: Every day | ORAL | Status: DC

## 2014-07-03 NOTE — Progress Notes (Signed)
[facility] [address] [place], [place] [address] Phone: [phone] Subjective:    I'm seeing this patient by the request  of:  Sonda Primes, MD   CC: back painwith right leg pain.  BJY:[id number] Drew Hansen is a 64 y.o. male coming in with complaint of right leg pain that seems to be associated with back pain. 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 Patient states with time it did seem to improve. Patient did not have any surgery. Patient states now though it seems that he is having pain mostly localized in the anterior hip region. Patient states that this can give him some discomfort. Patient states when he is walking he does not have any pain or even when he is running. Patient states though afterwards after sitting a long amount of time it is a severe pain when he is trying to stand up. Patient states sometimes a see them back pain but sometimes there is no back pain. Denies any radiation down the leg but states that there is an area about the size of accord or where there is a numbness on the anterior aspect of his thigh. Denies any weakness. Patient states sometimes he can be uncomfortable at night but it does not seem to wake him up at night. Patient rates the severity of pain of 8 out of 10.     Past medical history, social, surgical and family history all reviewed in electronic medical record.   Review of Systems: No headache, visual changes, nausea, vomiting, diarrhea, constipation, dizziness, abdominal pain, skin rash, fevers, chills, night sweats, weight loss, swollen lymph nodes, body aches, joint swelling, muscle aches, chest pain, shortness of breath, mood changes.   Objective Blood pressure 126/80, pulse 75, height  (1.727 m), weight 247 lb (112.038 kg), SpO2 93 %.  General: No apparent distress alert and oriented x3  mood and affect normal, dressed appropriately.  HEENT: Pupils equal, extraocular movements intact  Respiratory: Patient's speak in full sentences and does not appear short of breath  Cardiovascular: No lower extremity edema, non tender, no erythema  Skin: Warm dry intact with no signs of infection or rash on extremities or on axial skeleton.  Abdomen: Soft nontender  Neuro: Cranial nerves II through XII are intact, neurovascularly intact in all extremities with 2+ DTRs and 2+ pulses.  Lymph: No lymphadenopathy of posterior or anterior cervical chain or axillae bilaterally.  Gait normal with good balance and coordination.  MSK:  Non tender with full range of motion and good stability and symmetric strength and tone of shoulders, elbows, wrist, , knee and ankles bilaterally.  Back Exam:  Inspection: Unremarkable  Motion: Flexion 35 deg, Extension 35 deg, Side Bending to 35 deg bilaterally,  Rotation to 35 deg bilaterally  SLR laying: Negative  XSLR laying: Negative  Palpable tenderness: minimal tenderness in the paraspinal musculature FABER: negative.patellar bilaterally Sensory change: Gross sensation intact to all lumbar and sacral dermatomes.  Reflexes: 2+ at both patellar tendons, 2+ at achilles tendons, Babinski's downgoing.  Strength at foot  Plantar-flexion: 5/5 Dorsi-flexion: 5/5 Eversion: 5/5 Inversion: 5/5  Leg strength  Quad: 5/5 Hamstring: 5/5 Hip flexor: 5/5 Hip abductors: 4/5  Gait unremarkable.   Hip: ROM IR: 25 Deg, ER: 45 Deg, Flexion: 110 Deg, Extension: 100 Deg, Abduction: 45 Deg, Adduction: 45 Deg Strength IR: 4/5, ER: 5/5, Flexion:  5/5, Extension: 5/5, Abduction: 4/5, Adduction: 5/5 Pelvic alignment unremarkable to inspection and palpation. Standing hip rotation and gait without trendelenburg sign / unsteadiness. Greater trochanter without tenderness to palpation. No tenderness over piriformis and greater trochanter. patientthough is tender to palpation mostly  over the insertion of the hip flexors as well as the tensor fossa lata No pain with FABER or FADIR. No SI joint tenderness and normal minimal SI movement.   Impression and Recommendations:     This case required medical decision making of moderate complexity.

## 2014-07-03 NOTE — Assessment & Plan Note (Signed)
I believe the patient is having more of a snapping hip syndrome. I think the patient is having possibly also a flexor tendinitis at its insertion. Think these 2 things are causing patient to have the uncomfortable feeling. Patient is fairly adamant that he believes that this is his low back pain with radicular symptoms. Patient is not having significant low back pain and it does seem to improve with activity which would go against this diagnosis. We discussed anti-inflammatories, home exercises, compression sleeve, and natural supplementation. Patient will try to make these changes. Patient will try to do some cross training and not walking or running as much. Patient and will come back and see me again in 3 weeks. Patient continues to have pain we'll consider imaging including back and hip x-rays.

## 2014-07-03 NOTE — Patient Instructions (Signed)
Good to meet you Meloxicam daily for 10 days then as needed Ice 20 minutes 2 times daily. Usually after activity and before bed. Exercises 3-5 times a week.  Continue the vitamin D 2000 IU daily Turmeric 500mg  twice daily See me again in 3 weeks.

## 2014-07-03 NOTE — Progress Notes (Signed)
Pre visit review using our clinic review tool, if applicable. No additional management support is needed unless otherwise documented below in the visit note. 

## 2014-07-21 ENCOUNTER — Telehealth: Payer: Self-pay | Admitting: *Deleted

## 2014-07-21 ENCOUNTER — Telehealth: Payer: Self-pay | Admitting: Internal Medicine

## 2014-07-21 NOTE — Telephone Encounter (Signed)
No need for another abx round OV if not better Thx

## 2014-07-21 NOTE — Telephone Encounter (Signed)
Pt informed

## 2014-07-21 NOTE — Telephone Encounter (Signed)
Patient is asking for a call concerning his "health condition". He did not give any other details.Drew Hansen..Drew Hansen

## 2014-07-21 NOTE — Telephone Encounter (Signed)
[facility] - Client TELEPHONE ADVICE RECORD [facility] Patient Name: Drew DingwallSAMUEL DANAGOULIA Hansen Gender: Male DOB: 05/22/1951 Age: 64 Y 1 M 24 D Return Phone Number: [phone] (Primary), [phone] (Secondary) Address: [place]: [place] [id number] Client [facility] - Client Client Site [facility] Day Physician Plotnikov, Alex Contact Type Call Call Type Triage / Clinical Relationship To Patient Self Appointment Disposition EMR Appointment Not Necessary Return Phone Number [phone] (Primary) Chief Complaint Cough Initial Comment Caller states he had pneumonia 2-3 wks ago, has started coughing again Info pasted into Epic Yes Nurse Assessment Nurse: Scarlette ArStandifer, RN, Drew Hansen Date/Time (Eastern Time): 07/21/2014 10:07:06 AM Confirm and document reason for call. If symptomatic, describe symptoms. ---Caller states that he only wants to speak with Dr. Loren RacerPlotnikov's nurse, she knows what is going on with him. He does not want to speak with anyone else. Has the patient traveled out of the country within the last 30 days? ---Not Applicable Does the patient require triage? ---No Please document clinical information provided and list any resource used. ---Called office to leave a message for Dr. Loren RacerPlotnikov's nurse, was on hold for over 5 min, will fax info to office and put message in EPIC. Guidelines Guideline Title Affirmed Question Affirmed Notes Nurse Date/Time (Eastern Time) Disp. Time Lamount Cohen(Eastern Time) Disposition Final User 07/21/2014 10:11:41 AM Clinical Call Yes Standifer, RN, Herbert SetaHeather After Care Instructions Given Call Event Type User Date / Time Description

## 2014-07-21 NOTE — Telephone Encounter (Signed)
Patient Name: Drew CritchleySAMUEL Hansen  DOB: 03/15/1951    Initial Comment Caller states he had pneumonia 2-3 wks ago, has started coughing again   Nurse Assessment  Nurse: Scarlette ArStandifer, RN, Heather Date/Time (Eastern Time): 07/21/2014 10:07:06 AM  Confirm and document reason for call. If symptomatic, describe symptoms. ---Caller states that he only wants to speak with Dr. Loren RacerPlotnikov's nurse, she knows what is going on with him. He does not want to speak with anyone else.  Has the patient traveled out of the country within the last 30 days? ---Not Applicable  Does the patient require triage? ---No  Please document clinical information provided and list any resource used. ---Called office to leave a message for Dr. Loren RacerPlotnikov's nurse, was on hold for over 5 min, will fax info to office and put message in EPIC.     Guidelines    Guideline Title Affirmed Question Affirmed Notes       Final Disposition User

## 2014-07-21 NOTE — Telephone Encounter (Signed)
Pt completed abx for pneumonia. He is c/o productive cough. He started playing tennis and ping pong last week and denies any other symptoms. He wants to know if he needs another round of abxs. Please advise.

## 2015-06-14 ENCOUNTER — Telehealth: Payer: Self-pay | Admitting: Internal Medicine

## 2015-06-14 DIAGNOSIS — Z0000 Encounter for general adult medical examination without abnormal findings: Secondary | ICD-10-CM

## 2015-06-14 DIAGNOSIS — R972 Elevated prostate specific antigen [PSA]: Secondary | ICD-10-CM

## 2015-06-14 NOTE — Telephone Encounter (Signed)
Pt is having his physical next week and he would like to have his lab work done ahead of time.  Please advise

## 2015-06-15 NOTE — Addendum Note (Signed)
Addended by: Tresa Garter on: 06/15/2015 01:30 PM   Modules accepted: Orders

## 2015-06-15 NOTE — Telephone Encounter (Signed)
Ok Thx 

## 2015-06-21 ENCOUNTER — Other Ambulatory Visit (INDEPENDENT_AMBULATORY_CARE_PROVIDER_SITE_OTHER): Payer: BC Managed Care – PPO

## 2015-06-21 DIAGNOSIS — R972 Elevated prostate specific antigen [PSA]: Secondary | ICD-10-CM

## 2015-06-21 DIAGNOSIS — Z0000 Encounter for general adult medical examination without abnormal findings: Secondary | ICD-10-CM

## 2015-06-21 LAB — URINALYSIS, ROUTINE W REFLEX MICROSCOPIC
Bilirubin Urine: NEGATIVE
Ketones, ur: NEGATIVE
Leukocytes, UA: NEGATIVE
NITRITE: NEGATIVE
SPECIFIC GRAVITY, URINE: 1.025 (ref 1.000–1.030)
Total Protein, Urine: 30 — AB
URINE GLUCOSE: NEGATIVE
UROBILINOGEN UA: 0.2 (ref 0.0–1.0)
pH: 6 (ref 5.0–8.0)

## 2015-06-21 LAB — HEPATIC FUNCTION PANEL
ALT: 38 U/L (ref 0–53)
AST: 26 U/L (ref 0–37)
Albumin: 4.2 g/dL (ref 3.5–5.2)
Alkaline Phosphatase: 59 U/L (ref 39–117)
BILIRUBIN DIRECT: 0.1 mg/dL (ref 0.0–0.3)
TOTAL PROTEIN: 7.3 g/dL (ref 6.0–8.3)
Total Bilirubin: 0.7 mg/dL (ref 0.2–1.2)

## 2015-06-21 LAB — LIPID PANEL
CHOL/HDL RATIO: 5
CHOLESTEROL: 195 mg/dL (ref 0–200)
HDL: 38.6 mg/dL — ABNORMAL LOW (ref >39.00)
LDL Cholesterol: 125 mg/dL — ABNORMAL HIGH (ref 0–99)
NONHDL: 156.08
Triglycerides: 157 mg/dL — ABNORMAL HIGH (ref 0.0–149.0)
VLDL: 31.4 mg/dL (ref 0.0–40.0)

## 2015-06-21 LAB — CBC WITH DIFFERENTIAL/PLATELET
BASOS PCT: 0.6 % (ref 0.0–3.0)
Basophils Absolute: 0 10*3/uL (ref 0.0–0.1)
EOS PCT: 2.6 % (ref 0.0–5.0)
Eosinophils Absolute: 0.2 10*3/uL (ref 0.0–0.7)
HCT: 46.6 % (ref 39.0–52.0)
HEMOGLOBIN: 15.8 g/dL (ref 13.0–17.0)
LYMPHS ABS: 1.7 10*3/uL (ref 0.7–4.0)
Lymphocytes Relative: 29.6 % (ref 12.0–46.0)
MCHC: 33.9 g/dL (ref 30.0–36.0)
MCV: 88.2 fl (ref 78.0–100.0)
MONO ABS: 0.4 10*3/uL (ref 0.1–1.0)
Monocytes Relative: 7.4 % (ref 3.0–12.0)
Neutro Abs: 3.5 10*3/uL (ref 1.4–7.7)
Neutrophils Relative %: 59.8 % (ref 43.0–77.0)
Platelets: 186 10*3/uL (ref 150.0–400.0)
RBC: 5.29 Mil/uL (ref 4.22–5.81)
RDW: 13.3 % (ref 11.5–15.5)
WBC: 5.9 10*3/uL (ref 4.0–10.5)

## 2015-06-21 LAB — BASIC METABOLIC PANEL
BUN: 16 mg/dL (ref 6–23)
CHLORIDE: 103 meq/L (ref 96–112)
CO2: 30 mEq/L (ref 19–32)
Calcium: 9.4 mg/dL (ref 8.4–10.5)
Creatinine, Ser: 0.91 mg/dL (ref 0.40–1.50)
GFR: 88.85 mL/min (ref >60.00)
GLUCOSE: 141 mg/dL — AB (ref 70–99)
POTASSIUM: 4.7 meq/L (ref 3.5–5.1)
Sodium: 140 mEq/L (ref 135–145)

## 2015-06-21 LAB — TSH: TSH: 1.79 u[IU]/mL (ref 0.35–4.50)

## 2015-06-22 ENCOUNTER — Ambulatory Visit (INDEPENDENT_AMBULATORY_CARE_PROVIDER_SITE_OTHER)
Admission: RE | Admit: 2015-06-22 | Discharge: 2015-06-22 | Disposition: A | Discharge status: Home / Self Care | Payer: BC Managed Care – PPO | Source: Ambulatory Visit | Attending: Internal Medicine | Admitting: Internal Medicine

## 2015-06-22 ENCOUNTER — Ambulatory Visit (INDEPENDENT_AMBULATORY_CARE_PROVIDER_SITE_OTHER): Payer: BC Managed Care – PPO | Admitting: Internal Medicine

## 2015-06-22 ENCOUNTER — Encounter: Payer: Self-pay | Admitting: Internal Medicine

## 2015-06-22 VITALS — BP 120/74 | HR 73 | Ht 68.0 in | Wt 252.0 lb

## 2015-06-22 DIAGNOSIS — E559 Vitamin D deficiency, unspecified: Secondary | ICD-10-CM

## 2015-06-22 DIAGNOSIS — R972 Elevated prostate specific antigen [PSA]: Secondary | ICD-10-CM

## 2015-06-22 DIAGNOSIS — J189 Pneumonia, unspecified organism: Secondary | ICD-10-CM

## 2015-06-22 DIAGNOSIS — Z0000 Encounter for general adult medical examination without abnormal findings: Secondary | ICD-10-CM

## 2015-06-22 DIAGNOSIS — J181 Lobar pneumonia, unspecified organism: Secondary | ICD-10-CM

## 2015-06-22 LAB — PSA, TOTAL AND FREE
PSA FREE: 1.34 ng/mL
PSA, Free Pct: 10 % — ABNORMAL LOW (ref >25)
PSA: 12.93 ng/mL — ABNORMAL HIGH (ref <=4.00)

## 2015-06-22 NOTE — Progress Notes (Signed)
Pre visit review using our clinic review tool, if applicable. No additional management support is needed unless otherwise documented below in the visit note. 

## 2015-06-22 NOTE — Assessment & Plan Note (Signed)
On Vit D 

## 2015-06-22 NOTE — Progress Notes (Signed)
Subjective:  Patient ID: Drew Hansen, male    DOB: [birth year]  Age: 65 y.o. MRN: [id number]  CC: Annual Exam   HPI Drew Hansen presents for a well exam. F/u elevated PSA. C/o wt gain.  Outpatient Prescriptions Prior to Visit  Medication Sig Dispense Refill  . aspirin 81 MG EC tablet Take 81 mg by mouth daily.      . cholecalciferol (VITAMIN D) 1000 UNITS tablet Take 2,000 Units by mouth daily.      Marland Kitchen ibuprofen (ADVIL,MOTRIN) 600 MG tablet Take 600 mg by mouth 2 (two) times daily as needed.      . benzonatate (TESSALON) 200 MG capsule Take 1 capsule (200 mg total) by mouth 2 (two) times daily as needed for cough. 60 capsule 0  . clarithromycin (BIAXIN) 500 MG tablet Take 1 tablet (500 mg total) by mouth 2 (two) times daily. 28 tablet 0  . meloxicam (MOBIC) 15 MG tablet Take 1 tablet (15 mg total) by mouth daily. 30 tablet 0  . promethazine-codeine (PHENERGAN WITH CODEINE) 6.25-10 MG/5ML syrup Take 5 mLs by mouth every 4 (four) hours as needed. 300 mL 0   No facility-administered medications prior to visit.    ROS Review of Systems  Constitutional: Negative for appetite change, fatigue and unexpected weight change.  HENT: Negative for congestion, facial swelling, nosebleeds, sneezing, sore throat and trouble swallowing.   Eyes: Negative for itching and visual disturbance.  Respiratory: Negative for cough.   Cardiovascular: Negative for chest pain, palpitations and leg swelling.  Gastrointestinal: Negative for nausea, vomiting, diarrhea, blood in stool and abdominal distention.  Genitourinary: Negative for frequency and hematuria.  Musculoskeletal: Negative for back pain, joint swelling, gait problem and neck pain.  Skin: Negative for rash.  Neurological: Negative for dizziness, tremors, speech difficulty and weakness.  Psychiatric/Behavioral: Negative for sleep disturbance, dysphoric mood and agitation. The patient is not nervous/anxious.     Objective:  BP 120/74  mmHg  Pulse 73  Ht  (1.727 m)  Wt 252 lb (114.306 kg)  BMI 38.33 kg/m2  SpO2 96%  BP Readings from Last 3 Encounters:  06/22/15 120/74  07/03/14 126/80  06/24/14 128/76    Wt Readings from Last 3 Encounters:  06/22/15 252 lb (114.306 kg)  07/03/14 247 lb (112.038 kg)  06/24/14 243 lb (110.224 kg)    Physical Exam  Constitutional: He is oriented to person, place, and time. He appears well-developed and well-nourished. No distress.  HENT:  Head: Normocephalic and atraumatic.  Right Ear: External ear normal.  Left Ear: External ear normal.  Nose: Nose normal.  Mouth/Throat: Oropharynx is clear and moist. No oropharyngeal exudate.  Eyes: Conjunctivae and EOM are normal. Pupils are equal, round, and reactive to light. Right eye exhibits no discharge. Left eye exhibits no discharge. No scleral icterus.  Neck: Normal range of motion. Neck supple. No JVD present. No tracheal deviation present. No thyromegaly present.  Cardiovascular: Normal rate, regular rhythm, normal heart sounds and intact distal pulses.  Exam reveals no gallop and no friction rub.   No murmur heard. Pulmonary/Chest: Effort normal and breath sounds normal. No stridor. No respiratory distress. He has no wheezes. He has no rales. He exhibits no tenderness.  Abdominal: Soft. Bowel sounds are normal. He exhibits no distension and no mass. There is no tenderness. There is no rebound and no guarding.  Genitourinary: Rectum normal, prostate normal and penis normal. Guaiac negative stool. No penile tenderness.  Musculoskeletal: Normal range of motion. He exhibits  no edema or tenderness.  Lymphadenopathy:    He has no cervical adenopathy.  Neurological: He is alert and oriented to person, place, and time. He has normal reflexes. No cranial nerve deficit. He exhibits normal muscle tone. Coordination normal.  Skin: Skin is warm and dry. No rash noted. He is not diaphoretic. No erythema. No pallor.  Psychiatric: He has a  normal mood and affect. His behavior is normal. Judgment and thought content normal.  Rectum/prostate NE (per Urology) Obese  Lab Results  Component Value Date   WBC 5.9 06/21/2015   HGB 15.8 06/21/2015   HCT 46.6 06/21/2015   PLT 186.0 06/21/2015   GLUCOSE 141* 06/21/2015   CHOL 195 06/21/2015   TRIG 157.0* 06/21/2015   HDL 38.60* 06/21/2015   LDLCALC 125* 06/21/2015   ALT 38 06/21/2015   AST 26 06/21/2015   NA 140 06/21/2015   K 4.7 06/21/2015   CL 103 06/21/2015   CREATININE 0.91 06/21/2015   BUN 16 06/21/2015   CO2 30 06/21/2015   TSH 1.79 06/21/2015   PSA 12.93* 06/21/2015   HGBA1C 5.2 11/15/2009    Dg Chest 2 View  06/24/2014  CLINICAL DATA:  Cough for 3 weeks, congestion EXAM: CHEST  2 VIEW COMPARISON:  Chest x-ray of 01/31/2007 FINDINGS: There are prominent markings at the left lung base consistent with left lower lobe pneumonia. No pleural effusion is seen. Followup is recommended to ensure clearing. Mediastinal and hilar contours are unremarkable. The heart is within normal limits in size. There are degenerative changes throughout the thoracic spine. IMPRESSION: Patchy opacity in the left lower lobe consistent with pneumonia. Recommend followup chest x-ray. Electronically Signed   By: Dwyane Dee M.D.   On: 06/24/2014 14:48    Assessment & Plan:   Drew Hansen was seen today for annual exam.  Diagnoses and all orders for this visit:  Well adult exam  Vitamin D deficiency  PSA, INCREASED  Elevated PSA -     Ambulatory referral to Urology  LLL pneumonia -     DG Chest 2 View   I have discontinued Drew Hansen promethazine-codeine, benzonatate, clarithromycin, and meloxicam. I am also having him maintain his aspirin, ibuprofen, and cholecalciferol.  No orders of the defined types were placed in this encounter.     Follow-up: Return in about 6 months (around 12/20/2015) for a follow-up visit.  Sonda Primes, MD

## 2015-06-22 NOTE — Assessment & Plan Note (Signed)
Repeat CXR

## 2015-06-22 NOTE — Assessment & Plan Note (Signed)
PSA and free PSA 

## 2015-06-22 NOTE — Assessment & Plan Note (Signed)
We discussed age appropriate health related issues, including available/recomended screening tests and vaccinations. We discussed a need for adhering to healthy diet and exercise. Labs/EKG were reviewed/ordered. All questions were answered.   

## 2015-06-22 NOTE — Assessment & Plan Note (Addendum)
This SmartLink has not been configured with any valid records.   Elev PSA/low free PSA - worse Sch appt w/Dr Vernie Ammons

## 2016-06-12 ENCOUNTER — Encounter: Payer: Self-pay | Admitting: Internal Medicine

## 2016-06-16 ENCOUNTER — Encounter: Payer: Self-pay | Admitting: Internal Medicine

## 2016-06-19 ENCOUNTER — Other Ambulatory Visit: Payer: Self-pay | Admitting: Internal Medicine

## 2016-06-19 DIAGNOSIS — Z0000 Encounter for general adult medical examination without abnormal findings: Secondary | ICD-10-CM

## 2016-06-20 ENCOUNTER — Other Ambulatory Visit (INDEPENDENT_AMBULATORY_CARE_PROVIDER_SITE_OTHER): Payer: BC Managed Care – PPO

## 2016-06-20 DIAGNOSIS — R7989 Other specified abnormal findings of blood chemistry: Secondary | ICD-10-CM | POA: Diagnosis not present

## 2016-06-20 DIAGNOSIS — Z0000 Encounter for general adult medical examination without abnormal findings: Secondary | ICD-10-CM | POA: Diagnosis not present

## 2016-06-20 LAB — HEPATIC FUNCTION PANEL
ALT: 42 U/L (ref 0–53)
AST: 25 U/L (ref 0–37)
Albumin: 4.4 g/dL (ref 3.5–5.2)
Alkaline Phosphatase: 65 U/L (ref 39–117)
BILIRUBIN TOTAL: 0.7 mg/dL (ref 0.2–1.2)
Bilirubin, Direct: 0.1 mg/dL (ref 0.0–0.3)
Total Protein: 7.4 g/dL (ref 6.0–8.3)

## 2016-06-20 LAB — CBC WITH DIFFERENTIAL/PLATELET
BASOS PCT: 1.2 % (ref 0.0–3.0)
Basophils Absolute: 0.1 10*3/uL (ref 0.0–0.1)
Eosinophils Absolute: 0.3 10*3/uL (ref 0.0–0.7)
Eosinophils Relative: 4.7 % (ref 0.0–5.0)
HCT: 44.9 % (ref 39.0–52.0)
Hemoglobin: 15.7 g/dL (ref 13.0–17.0)
LYMPHS ABS: 1.7 10*3/uL (ref 0.7–4.0)
Lymphocytes Relative: 25.6 % (ref 12.0–46.0)
MCHC: 35 g/dL (ref 30.0–36.0)
MCV: 88.4 fl (ref 78.0–100.0)
MONO ABS: 0.3 10*3/uL (ref 0.1–1.0)
Monocytes Relative: 5.2 % (ref 3.0–12.0)
NEUTROS ABS: 4.1 10*3/uL (ref 1.4–7.7)
Neutrophils Relative %: 63.3 % (ref 43.0–77.0)
PLATELETS: 178 10*3/uL (ref 150.0–400.0)
RBC: 5.08 Mil/uL (ref 4.22–5.81)
RDW: 14 % (ref 11.5–15.5)
WBC: 6.5 10*3/uL (ref 4.0–10.5)

## 2016-06-20 LAB — URINALYSIS
Bilirubin Urine: NEGATIVE
Ketones, ur: NEGATIVE
Leukocytes, UA: NEGATIVE
Nitrite: NEGATIVE
SPECIFIC GRAVITY, URINE: 1.02 (ref 1.000–1.030)
URINE GLUCOSE: NEGATIVE
Urobilinogen, UA: 0.2 (ref 0.0–1.0)
pH: 6 (ref 5.0–8.0)

## 2016-06-20 LAB — BASIC METABOLIC PANEL
BUN: 14 mg/dL (ref 6–23)
CO2: 32 mEq/L (ref 19–32)
Calcium: 9.5 mg/dL (ref 8.4–10.5)
Chloride: 106 mEq/L (ref 96–112)
Creatinine, Ser: 0.95 mg/dL (ref 0.40–1.50)
GFR: 84.29 mL/min (ref >60.00)
Glucose, Bld: 166 mg/dL — ABNORMAL HIGH (ref 70–99)
POTASSIUM: 4.5 meq/L (ref 3.5–5.1)
SODIUM: 142 meq/L (ref 135–145)

## 2016-06-20 LAB — LIPID PANEL
CHOL/HDL RATIO: 4
Cholesterol: 176 mg/dL (ref 0–200)
HDL: 44.9 mg/dL (ref >39.00)
NonHDL: 131.23
Triglycerides: 235 mg/dL — ABNORMAL HIGH (ref 0.0–149.0)
VLDL: 47 mg/dL — AB (ref 0.0–40.0)

## 2016-06-20 LAB — LDL CHOLESTEROL, DIRECT: Direct LDL: 102 mg/dL

## 2016-06-20 LAB — TSH: TSH: 1.7 u[IU]/mL (ref 0.35–4.50)

## 2016-06-21 LAB — PSA, TOTAL AND FREE
PSA, Free: 1.7 ng/mL
PSA, Total: 14.8 ng/mL — ABNORMAL HIGH (ref <=4.0)

## 2016-06-22 ENCOUNTER — Encounter: Payer: Self-pay | Admitting: Internal Medicine

## 2016-06-22 ENCOUNTER — Ambulatory Visit (INDEPENDENT_AMBULATORY_CARE_PROVIDER_SITE_OTHER): Payer: BC Managed Care – PPO | Admitting: Internal Medicine

## 2016-06-22 VITALS — BP 122/76 | HR 72 | Temp 98.3°F | Resp 18 | Ht 68.0 in | Wt 248.0 lb

## 2016-06-22 DIAGNOSIS — E559 Vitamin D deficiency, unspecified: Secondary | ICD-10-CM

## 2016-06-22 DIAGNOSIS — R972 Elevated prostate specific antigen [PSA]: Secondary | ICD-10-CM | POA: Diagnosis not present

## 2016-06-22 DIAGNOSIS — Z0000 Encounter for general adult medical examination without abnormal findings: Secondary | ICD-10-CM | POA: Diagnosis not present

## 2016-06-22 DIAGNOSIS — M5441 Lumbago with sciatica, right side: Secondary | ICD-10-CM

## 2016-06-22 DIAGNOSIS — Z23 Encounter for immunization: Secondary | ICD-10-CM

## 2016-06-22 DIAGNOSIS — G8929 Other chronic pain: Secondary | ICD-10-CM

## 2016-06-22 DIAGNOSIS — R739 Hyperglycemia, unspecified: Secondary | ICD-10-CM

## 2016-06-22 NOTE — Patient Instructions (Addendum)
Try TRE - Trauma Release Exercise  Health Maintenance, Male A healthy lifestyle and preventative care can promote health and wellness.  Maintain regular health, dental, and eye exams.  Eat a healthy diet. Foods like vegetables, fruits, whole grains, low-fat dairy products, and lean protein foods contain the nutrients you need and are low in calories. Decrease your intake of foods high in solid fats, added sugars, and salt. Get information about a proper diet from your health care provider, if necessary.  Regular physical exercise is one of the most important things you can do for your health. Most adults should get at least 150 minutes of moderate-intensity exercise (any activity that increases your heart rate and causes you to sweat) each week. In addition, most adults need muscle-strengthening exercises on 2 or more days a week.   Maintain a healthy weight. The body mass index (BMI) is a screening tool to identify possible weight problems. It provides an estimate of body fat based on height and weight. Your health care provider can find your BMI and can help you achieve or maintain a healthy weight. For males 20 years and older:  A BMI below 18.5 is considered underweight.  A BMI of 18.5 to 24.9 is normal.  A BMI of 25 to 29.9 is considered overweight.  A BMI of 30 and above is considered obese.  Maintain normal blood lipids and cholesterol by exercising and minimizing your intake of saturated fat. Eat a balanced diet with plenty of fruits and vegetables. Blood tests for lipids and cholesterol should begin at age 66 and be repeated every 5 years. If your lipid or cholesterol levels are high, you are over age 66, or you are at high risk for heart disease, you may need your cholesterol levels checked more frequently.Ongoing high lipid and cholesterol levels should be treated with medicines if diet and exercise are not working.  If you smoke, find out from your health care provider how to  quit. If you do not use tobacco, do not start.  Lung cancer screening is recommended for adults aged 55-80 years who are at high risk for developing lung cancer because of a history of smoking. A yearly low-dose CT scan of the lungs is recommended for people who have at least a 30-pack-year history of smoking and are current smokers or have quit within the past 15 years. A pack year of smoking is smoking an average of 1 pack of cigarettes a day for 1 year (for example, a 30-pack-year history of smoking could mean smoking 1 pack a day for 30 years or 2 packs a day for 15 years). Yearly screening should continue until the smoker has stopped smoking for at least 15 years. Yearly screening should be stopped for people who develop a health problem that would prevent them from having lung cancer treatment.  If you choose to drink alcohol, do not have more than 2 drinks per day. One drink is considered to be 12 oz (360 mL) of beer, 5 oz (150 mL) of wine, or 1.5 oz (45 mL) of liquor.  Avoid the use of street drugs. Do not share needles with anyone. Ask for help if you need support or instructions about stopping the use of drugs.  High blood pressure causes heart disease and increases the risk of stroke. High blood pressure is more likely to develop in:  People who have blood pressure in the end of the normal range (100-139/85-89 mm Hg).  People who are overweight or obese.  People who are African American.  If you are 52-68 years of age, have your blood pressure checked every 3-5 years. If you are 60 years of age or older, have your blood pressure checked every year. You should have your blood pressure measured twice-once when you are at a hospital or clinic, and once when you are not at a hospital or clinic. Record the average of the two measurements. To check your blood pressure when you are not at a hospital or clinic, you can use:  An automated blood pressure machine at a pharmacy.  A home blood  pressure monitor.  If you are 23-16 years old, ask your health care provider if you should take aspirin to prevent heart disease.  Diabetes screening involves taking a blood sample to check your fasting blood sugar level. This should be done once every 3 years after age 65 if you are at a normal weight and without risk factors for diabetes. Testing should be considered at a younger age or be carried out more frequently if you are overweight and have at least 1 risk factor for diabetes.  Colorectal cancer can be detected and often prevented. Most routine colorectal cancer screening begins at the age of 78 and continues through age 33. However, your health care provider may recommend screening at an earlier age if you have risk factors for colon cancer. On a yearly basis, your health care provider may provide home test kits to check for hidden blood in the stool. A small camera at the end of a tube may be used to directly examine the colon (sigmoidoscopy or colonoscopy) to detect the earliest forms of colorectal cancer. Talk to your health care provider about this at age 40 when routine screening begins. A direct exam of the colon should be repeated every 5-10 years through age 33, unless early forms of precancerous polyps or small growths are found.  People who are at an increased risk for hepatitis B should be screened for this virus. You are considered at high risk for hepatitis B if:  You were born in a country where hepatitis B occurs often. Talk with your health care provider about which countries are considered high risk.  Your parents were born in a high-risk country and you have not received a shot to protect against hepatitis B (hepatitis B vaccine).  You have HIV or AIDS.  You use needles to inject street drugs.  You live with, or have sex with, someone who has hepatitis B.  You are a man who has sex with other men (MSM).  You get hemodialysis treatment.  You take certain medicines  for conditions like cancer, organ transplantation, and autoimmune conditions.  Hepatitis C blood testing is recommended for all people born from [birth year] through 1965 and any individual with known risk factors for hepatitis C.  Healthy men should no longer receive prostate-specific antigen (PSA) blood tests as part of routine cancer screening. Talk to your health care provider about prostate cancer screening.  Testicular cancer screening is not recommended for adolescents or adult males who have no symptoms. Screening includes self-exam, a health care provider exam, and other screening tests. Consult with your health care provider about any symptoms you have or any concerns you have about testicular cancer.  Practice safe sex. Use condoms and avoid high-risk sexual practices to reduce the spread of sexually transmitted infections (STIs).  You should be screened for STIs, including gonorrhea and chlamydia if:  You are sexually active and are  younger than 24 years.  You are older than 24 years, and your health care provider tells you that you are at risk for this type of infection.  Your sexual activity has changed since you were last screened, and you are at an increased risk for chlamydia or gonorrhea. Ask your health care provider if you are at risk.  If you are at risk of being infected with HIV, it is recommended that you take a prescription medicine daily to prevent HIV infection. This is called pre-exposure prophylaxis (PrEP). You are considered at risk if:  You are a man who has sex with other men (MSM).  You are a heterosexual man who is sexually active with multiple partners.  You take drugs by injection.  You are sexually active with a partner who has HIV.  Talk with your health care provider about whether you are at high risk of being infected with HIV. If you choose to begin PrEP, you should first be tested for HIV. You should then be tested every 3 months for as long as you are  taking PrEP.  Use sunscreen. Apply sunscreen liberally and repeatedly throughout the day. You should seek shade when your shadow is shorter than you. Protect yourself by wearing long sleeves, pants, a wide-brimmed hat, and sunglasses year round whenever you are outdoors.  Tell your health care provider of new moles or changes in moles, especially if there is a change in shape or color. Also, tell your health care provider if a mole is larger than the size of a pencil eraser.  A one-time screening for abdominal aortic aneurysm (AAA) and surgical repair of large AAAs by ultrasound is recommended for men aged 54-75 years who are current or former smokers.  Stay current with your vaccines (immunizations). This information is not intended to replace advice given to you by your health care provider. Make sure you discuss any questions you have with your health care provider. Document Released: 11/04/2007 Document Revised: 05/29/2014 Document Reviewed: 02/09/2015 Elsevier Interactive Patient Education  2017 [employer].

## 2016-06-22 NOTE — Assessment & Plan Note (Signed)
Worse Loose wt Labs in 3 mo

## 2016-06-22 NOTE — Progress Notes (Signed)
Pre visit review using our clinic review tool, if applicable. No additional management support is needed unless otherwise documented below in the visit note. 

## 2016-06-22 NOTE — Progress Notes (Signed)
Subjective:  Patient ID: Drew Hansen, male    DOB: December 22, 1950  Age: 66 y.o. MRN: [id number]  CC: No chief complaint on file.   HPI [name] presents for a well exam. F/u elev PSA, LBP on the R - not better  Outpatient Medications Prior to Visit  Medication Sig Dispense Refill  . cholecalciferol (VITAMIN D) 1000 UNITS tablet Take 2,000 Units by mouth daily.      Marland Kitchen ibuprofen (ADVIL,MOTRIN) 600 MG tablet Take 600 mg by mouth 2 (two) times daily as needed.      Marland Kitchen aspirin 81 MG EC tablet Take 81 mg by mouth daily.       No facility-administered medications prior to visit.     ROS Review of Systems  Constitutional: Negative for appetite change, fatigue and unexpected weight change.  HENT: Negative for congestion, nosebleeds, sneezing, sore throat and trouble swallowing.   Eyes: Negative for itching and visual disturbance.  Respiratory: Negative for cough.   Cardiovascular: Negative for chest pain, palpitations and leg swelling.  Gastrointestinal: Negative for abdominal distention, blood in stool, diarrhea and nausea.  Genitourinary: Negative for frequency and hematuria.  Musculoskeletal: Negative for back pain, gait problem, joint swelling and neck pain.  Skin: Negative for rash.  Neurological: Negative for dizziness, tremors, speech difficulty and weakness.  Psychiatric/Behavioral: Negative for agitation, dysphoric mood, sleep disturbance and suicidal ideas. The patient is not nervous/anxious.     Objective:  BP 122/76   Pulse 72   Temp 98.3 F (36.8 C) (Oral)   Resp 18   Ht 5\' 8"  (1.727 m)   Wt 248 lb (112.5 kg)   SpO2 97%   BMI 37.71 kg/m   BP Readings from Last 3 Encounters:  06/22/16 122/76  06/22/15 120/74  07/03/14 126/80    Wt Readings from Last 3 Encounters:  06/22/16 248 lb (112.5 kg)  06/22/15 252 lb (114.3 kg)  07/03/14 247 lb (112 kg)    Physical Exam  Constitutional: He is oriented to person, place, and time. He appears  well-developed and well-nourished. No distress.  HENT:  Head: Normocephalic and atraumatic.  Right Ear: External ear normal.  Left Ear: External ear normal.  Nose: Nose normal.  Mouth/Throat: Oropharynx is clear and moist. No oropharyngeal exudate.  Eyes: Conjunctivae and EOM are normal. Pupils are equal, round, and reactive to light. Right eye exhibits no discharge. Left eye exhibits no discharge. No scleral icterus.  Neck: Normal range of motion. Neck supple. No JVD present. No tracheal deviation present. No thyromegaly present.  Cardiovascular: Normal rate, regular rhythm, normal heart sounds and intact distal pulses.  Exam reveals no gallop and no friction rub.   No murmur heard. Pulmonary/Chest: Effort normal and breath sounds normal. No stridor. No respiratory distress. He has no wheezes. He has no rales. He exhibits no tenderness.  Abdominal: Soft. Bowel sounds are normal. He exhibits no distension and no mass. There is no tenderness. There is no rebound and no guarding.  Genitourinary: Rectum normal, prostate normal and penis normal. Rectal exam shows guaiac negative stool. No penile tenderness.  Musculoskeletal: Normal range of motion. He exhibits no edema or tenderness.  Lymphadenopathy:    He has no cervical adenopathy.  Neurological: He is alert and oriented to person, place, and time. He has normal reflexes. No cranial nerve deficit. He exhibits normal muscle tone. Coordination normal.  Skin: Skin is warm and dry. No rash noted. He is not diaphoretic. No erythema. No pallor.  Psychiatric: He has  a normal mood and affect. His behavior is normal. Judgment and thought content normal.  LS tender Rectal per Urology  Lab Results  Component Value Date   WBC 6.5 06/20/2016   HGB 15.7 06/20/2016   HCT 44.9 06/20/2016   PLT 178.0 06/20/2016   GLUCOSE 166 (H) 06/20/2016   CHOL 176 06/20/2016   TRIG 235.0 (H) 06/20/2016   HDL 44.90 06/20/2016   LDLDIRECT 102.0 06/20/2016   LDLCALC  125 (H) 06/21/2015   ALT 42 06/20/2016   AST 25 06/20/2016   NA 142 06/20/2016   K 4.5 06/20/2016   CL 106 06/20/2016   CREATININE 0.95 06/20/2016   BUN 14 06/20/2016   CO2 32 06/20/2016   TSH 1.70 06/20/2016   PSA 12.93 (H) 06/21/2015   HGBA1C 5.2 11/15/2009    Dg Chest 2 View  Result Date: 06/22/2015 CLINICAL DATA:  Followup pneumonia. No current complaints. Ex smoker. EXAM: CHEST  2 VIEW COMPARISON:  06/24/2014. FINDINGS: No evidence of pneumonia. Mild interstitial prominence is noted bilaterally. There are prominent bronchovascular markings in the lower lobes. These findings are stable. No evidence of pulmonary edema. No pleural effusion or pneumothorax. Cardiac silhouette is normal in size. No mediastinal or hilar masses or evidence of adenopathy. Bony thorax is intact. IMPRESSION: 1. No acute cardiopulmonary disease.  No evidence of pneumonia. Electronically Signed   By: Amie Portlandavid  Ormond M.D.   On: 06/22/2015 14:55    Assessment & Plan:   There are no diagnoses linked to this encounter. I am having Mr. Sharlee BlewDanagoulian maintain his aspirin, ibuprofen, and cholecalciferol.  No orders of the defined types were placed in this encounter.    Follow-up: No Follow-up on file.  Sonda PrimesAlex Christyn Gutkowski, MD

## 2016-06-22 NOTE — Assessment & Plan Note (Addendum)
Here for medicare wellness/physical  Diet: heart healthy  Physical activity: not sedentary  Depression/mood screen: negative  Hearing: intact to whispered voice  Visual acuity: grossly normal, performs annual eye exam  ADLs: capable  Fall risk: low to none  Home safety: good  Cognitive evaluation: intact to orientation, naming, recall and repetition  EOL planning: adv directives, full code/ I agree  I have personally reviewed and have noted  1. The patient's medical, surgical and social history  2. Their use of alcohol, tobacco or illicit drugs  3. Their current medications and supplements  4. The patient's functional ability including ADL's, fall risks, home safety risks and hearing or visual impairment.  5. Diet and physical activities  6. Evidence for depression or mood disorders 7. The roster of all physicians providing medical care to patient - is listed in the Snapshot section of the chart and reviewed today.    Today patient counseled on age appropriate routine health concerns for screening and prevention, each reviewed and up to date or declined. Immunizations reviewed and up to date or declined. Labs ordered and reviewed. Risk factors for depression reviewed and negative. Hearing function and visual acuity are intact. ADLs screened and addressed as needed. Functional ability and level of safety reviewed and appropriate. Education, counseling and referrals performed based on assessed risks today. Patient provided with a copy of personalized plan for preventive services.     Cologuard (-) 10/2013

## 2016-06-22 NOTE — Assessment & Plan Note (Signed)
Try TRE - Trauma Release Exercise 

## 2016-06-22 NOTE — Assessment & Plan Note (Addendum)
Lab Results  Component Value Date   PSA 12.93 (H) 06/21/2015   PSA 7.38 (H) 09/24/2012   PSA 4.25 (H) 05/26/2011   S/p bx Dr Vernie Ammonsttelin q 6 mo

## 2016-06-22 NOTE — Assessment & Plan Note (Signed)
On Vit D 

## 2016-08-10 ENCOUNTER — Other Ambulatory Visit: Payer: BC Managed Care – PPO

## 2016-08-17 ENCOUNTER — Other Ambulatory Visit (INDEPENDENT_AMBULATORY_CARE_PROVIDER_SITE_OTHER): Payer: BC Managed Care – PPO

## 2016-08-17 DIAGNOSIS — R739 Hyperglycemia, unspecified: Secondary | ICD-10-CM

## 2016-08-17 LAB — BASIC METABOLIC PANEL
BUN: 12 mg/dL (ref 6–23)
CHLORIDE: 104 meq/L (ref 96–112)
CO2: 29 meq/L (ref 19–32)
Calcium: 9.6 mg/dL (ref 8.4–10.5)
Creatinine, Ser: 1 mg/dL (ref 0.40–1.50)
GFR: 79.4 mL/min (ref >60.00)
GLUCOSE: 120 mg/dL — AB (ref 70–99)
Potassium: 4.6 mEq/L (ref 3.5–5.1)
SODIUM: 139 meq/L (ref 135–145)

## 2016-08-17 LAB — HEMOGLOBIN A1C: Hgb A1c MFr Bld: 5.3 % (ref 4.6–6.5)

## 2017-01-02 ENCOUNTER — Other Ambulatory Visit: Payer: Self-pay

## 2017-01-02 ENCOUNTER — Other Ambulatory Visit (INDEPENDENT_AMBULATORY_CARE_PROVIDER_SITE_OTHER): Payer: BC Managed Care – PPO

## 2017-01-02 DIAGNOSIS — E785 Hyperlipidemia, unspecified: Secondary | ICD-10-CM | POA: Diagnosis not present

## 2017-01-02 DIAGNOSIS — R739 Hyperglycemia, unspecified: Secondary | ICD-10-CM | POA: Diagnosis not present

## 2017-01-02 DIAGNOSIS — E559 Vitamin D deficiency, unspecified: Secondary | ICD-10-CM

## 2017-01-02 DIAGNOSIS — R972 Elevated prostate specific antigen [PSA]: Secondary | ICD-10-CM

## 2017-01-02 LAB — LIPID PANEL
CHOL/HDL RATIO: 4
Cholesterol: 164 mg/dL (ref 0–200)
HDL: 38.3 mg/dL — AB (ref >39.00)
LDL CALC: 101 mg/dL — AB (ref 0–99)
NONHDL: 126.14
TRIGLYCERIDES: 125 mg/dL (ref 0.0–149.0)
VLDL: 25 mg/dL (ref 0.0–40.0)

## 2017-01-02 LAB — URINALYSIS, ROUTINE W REFLEX MICROSCOPIC
Bilirubin Urine: NEGATIVE
Hgb urine dipstick: NEGATIVE
Ketones, ur: NEGATIVE
Leukocytes, UA: NEGATIVE
Nitrite: NEGATIVE
PH: 6.5 (ref 5.0–8.0)
RBC / HPF: NONE SEEN (ref 0–?)
SPECIFIC GRAVITY, URINE: 1.02 (ref 1.000–1.030)
Total Protein, Urine: NEGATIVE
UROBILINOGEN UA: 0.2 (ref 0.0–1.0)
Urine Glucose: NEGATIVE
WBC UA: NONE SEEN (ref 0–?)

## 2017-01-02 LAB — CBC WITH DIFFERENTIAL/PLATELET
BASOS PCT: 0.9 % (ref 0.0–3.0)
Basophils Absolute: 0 10*3/uL (ref 0.0–0.1)
EOS ABS: 0.1 10*3/uL (ref 0.0–0.7)
EOS PCT: 2.6 % (ref 0.0–5.0)
HCT: 45 % (ref 39.0–52.0)
HEMOGLOBIN: 15.7 g/dL (ref 13.0–17.0)
Lymphocytes Relative: 32.3 % (ref 12.0–46.0)
Lymphs Abs: 1.8 10*3/uL (ref 0.7–4.0)
MCHC: 34.8 g/dL (ref 30.0–36.0)
MCV: 89.3 fl (ref 78.0–100.0)
MONO ABS: 0.4 10*3/uL (ref 0.1–1.0)
Monocytes Relative: 7.7 % (ref 3.0–12.0)
NEUTROS ABS: 3.1 10*3/uL (ref 1.4–7.7)
Neutrophils Relative %: 56.5 % (ref 43.0–77.0)
PLATELETS: 178 10*3/uL (ref 150.0–400.0)
RBC: 5.05 Mil/uL (ref 4.22–5.81)
RDW: 13.9 % (ref 11.5–15.5)
WBC: 5.5 10*3/uL (ref 4.0–10.5)

## 2017-01-02 LAB — BASIC METABOLIC PANEL
BUN: 13 mg/dL (ref 6–23)
CHLORIDE: 105 meq/L (ref 96–112)
CO2: 28 mEq/L (ref 19–32)
CREATININE: 0.87 mg/dL (ref 0.40–1.50)
Calcium: 9.3 mg/dL (ref 8.4–10.5)
GFR: 93.14 mL/min (ref >60.00)
Glucose, Bld: 120 mg/dL — ABNORMAL HIGH (ref 70–99)
Potassium: 4.6 mEq/L (ref 3.5–5.1)
Sodium: 140 mEq/L (ref 135–145)

## 2017-01-02 LAB — PSA: PSA: 15.58 ng/mL — ABNORMAL HIGH (ref 0.10–4.00)

## 2017-01-02 LAB — VITAMIN D 25 HYDROXY (VIT D DEFICIENCY, FRACTURES): VITD: 23 ng/mL — ABNORMAL LOW (ref 30.00–100.00)

## 2017-01-03 ENCOUNTER — Encounter: Payer: Self-pay | Admitting: Internal Medicine

## 2017-01-03 ENCOUNTER — Ambulatory Visit (INDEPENDENT_AMBULATORY_CARE_PROVIDER_SITE_OTHER): Payer: BC Managed Care – PPO | Admitting: Internal Medicine

## 2017-01-03 DIAGNOSIS — R739 Hyperglycemia, unspecified: Secondary | ICD-10-CM | POA: Diagnosis not present

## 2017-01-03 DIAGNOSIS — R972 Elevated prostate specific antigen [PSA]: Secondary | ICD-10-CM | POA: Diagnosis not present

## 2017-01-03 DIAGNOSIS — E559 Vitamin D deficiency, unspecified: Secondary | ICD-10-CM | POA: Diagnosis not present

## 2017-01-03 MED ORDER — VITAMIN D 1000 UNITS PO TABS: 2000.0000 [IU] | ORAL_TABLET | Freq: Every day | ORAL | 5 refills | Status: DC

## 2017-01-03 MED ORDER — ERGOCALCIFEROL 1.25 MG (50000 UT) PO CAPS: 50000.0000 [IU] | ORAL_CAPSULE | ORAL | 0 refills | Status: AC

## 2017-01-03 NOTE — Progress Notes (Signed)
Subjective:  Patient ID: Drew Hansen, male    DOB: 05/11/1951  Age: 66 y.o. MRN: [id number]  CC: No chief complaint on file.   HPI Drew CritchleySamuel Hansen presents for 6 mo f/u: elevated PSA, low vit D, elevated glucose  Outpatient Medications Prior to Visit  Medication Sig Dispense Refill  . aspirin 81 MG EC tablet Take 81 mg by mouth daily.      . cholecalciferol (VITAMIN D) 1000 UNITS tablet Take 2,000 Units by mouth daily.      Marland Kitchen. ibuprofen (ADVIL,MOTRIN) 600 MG tablet Take 600 mg by mouth 2 (two) times daily as needed.       No facility-administered medications prior to visit.     ROS Review of Systems  Constitutional: Negative for appetite change, fatigue and unexpected weight change.  HENT: Negative for congestion, nosebleeds, sneezing, sore throat and trouble swallowing.   Eyes: Negative for itching and visual disturbance.  Respiratory: Negative for cough.   Cardiovascular: Negative for chest pain, palpitations and leg swelling.  Gastrointestinal: Negative for abdominal distention, blood in stool, diarrhea and nausea.  Genitourinary: Negative for frequency and hematuria.  Musculoskeletal: Negative for back pain, gait problem, joint swelling and neck pain.  Skin: Negative for rash.  Neurological: Negative for dizziness, tremors, speech difficulty and weakness.  Psychiatric/Behavioral: Negative for agitation, dysphoric mood and sleep disturbance. The patient is not nervous/anxious.     Objective:  BP 122/80 (BP Location: Left Arm, Patient Position: Sitting, Cuff Size: Large)   Pulse 66   Temp 98.3 F (36.8 C) (Oral)   Ht 5\' 8"  (1.727 m)   Wt 243 lb (110.2 kg)   SpO2 98%   BMI 36.95 kg/m   BP Readings from Last 3 Encounters:  01/03/17 122/80  06/22/16 122/76  06/22/15 120/74    Wt Readings from Last 3 Encounters:  01/03/17 243 lb (110.2 kg)  06/22/16 248 lb (112.5 kg)  06/22/15 252 lb (114.3 kg)    Physical Exam  Constitutional: He is oriented to  person, place, and time. He appears well-developed. No distress.  NAD  HENT:  Mouth/Throat: Oropharynx is clear and moist.  Eyes: Pupils are equal, round, and reactive to light. Conjunctivae are normal.  Neck: Normal range of motion. No JVD present. No thyromegaly present.  Cardiovascular: Normal rate, regular rhythm, normal heart sounds and intact distal pulses.  Exam reveals no gallop and no friction rub.   No murmur heard. Pulmonary/Chest: Effort normal and breath sounds normal. No respiratory distress. He has no wheezes. He has no rales. He exhibits no tenderness.  Abdominal: Soft. Bowel sounds are normal. He exhibits no distension and no mass. There is no tenderness. There is no rebound and no guarding.  Musculoskeletal: Normal range of motion. He exhibits no edema or tenderness.  Lymphadenopathy:    He has no cervical adenopathy.  Neurological: He is alert and oriented to person, place, and time. He has normal reflexes. No cranial nerve deficit. He exhibits normal muscle tone. He displays a negative Romberg sign. Coordination and gait normal.  Skin: Skin is warm and dry. No rash noted.  Psychiatric: He has a normal mood and affect. His behavior is normal. Judgment and thought content normal.    Lab Results  Component Value Date   WBC 5.5 01/02/2017   HGB 15.7 01/02/2017   HCT 45.0 01/02/2017   PLT 178.0 01/02/2017   GLUCOSE 120 (H) 01/02/2017   CHOL 164 01/02/2017   TRIG 125.0 01/02/2017   HDL 38.30 (L)  01/02/2017   LDLDIRECT 102.0 06/20/2016   LDLCALC 101 (H) 01/02/2017   ALT 42 06/20/2016   AST 25 06/20/2016   NA 140 01/02/2017   K 4.6 01/02/2017   CL 105 01/02/2017   CREATININE 0.87 01/02/2017   BUN 13 01/02/2017   CO2 28 01/02/2017   TSH 1.70 06/20/2016   PSA 15.58 (H) 01/02/2017   HGBA1C 5.3 08/17/2016    Dg Chest 2 View  Result Date: 06/22/2015 CLINICAL DATA:  Followup pneumonia. No current complaints. Ex smoker. EXAM: CHEST  2 VIEW COMPARISON:  06/24/2014.  FINDINGS: No evidence of pneumonia. Mild interstitial prominence is noted bilaterally. There are prominent bronchovascular markings in the lower lobes. These findings are stable. No evidence of pulmonary edema. No pleural effusion or pneumothorax. Cardiac silhouette is normal in size. No mediastinal or hilar masses or evidence of adenopathy. Bony thorax is intact. IMPRESSION: 1. No acute cardiopulmonary disease.  No evidence of pneumonia. Electronically Signed   By: Amie Portland M.D.   On: 06/22/2015 14:55    Assessment & Plan:   There are no diagnoses linked to this encounter. I am having Drew Hansen maintain his aspirin, ibuprofen, and cholecalciferol.  No orders of the defined types were placed in this encounter.    Follow-up: No Follow-up on file.  Sonda Primes, MD

## 2017-01-03 NOTE — Assessment & Plan Note (Signed)
Lab Results  Component Value Date   PSA 15.58 (H) 01/02/2017   PSA 12.93 (H) 06/21/2015   PSA 7.38 (H) 09/24/2012  F/u w/Dr Vernie Ammonsttelin

## 2017-01-03 NOTE — Assessment & Plan Note (Addendum)
You have a low Vitamin D levels - please, start Vit D prescription 50000 iu weekly (Rx emailed to your pharmacy) followed by over-the-counter Vit D 2000 iu daily.  

## 2017-01-03 NOTE — Assessment & Plan Note (Addendum)
Saw [facility] F/u w/Dr Vernie Ammonsttelin

## 2017-01-07 NOTE — Assessment & Plan Note (Signed)
Monitor glucose.

## 2017-06-22 ENCOUNTER — Telehealth: Payer: Self-pay

## 2017-06-22 DIAGNOSIS — Z0000 Encounter for general adult medical examination without abnormal findings: Secondary | ICD-10-CM

## 2017-06-22 NOTE — Telephone Encounter (Signed)
Labs ordered pt notified   Copied from CRM [id number]. Topic: Appointment Scheduling - Scheduling Inquiry for Clinic >> Jun 21, 2017  8:09 AM Diana EvesHoyt, Maryann B wrote: Reason for CRM: pt has cpe scheduled 06/27/17 and is requesting labs be done before appt.

## 2017-06-25 ENCOUNTER — Other Ambulatory Visit (INDEPENDENT_AMBULATORY_CARE_PROVIDER_SITE_OTHER): Payer: Medicare Other

## 2017-06-25 DIAGNOSIS — Z0000 Encounter for general adult medical examination without abnormal findings: Secondary | ICD-10-CM | POA: Diagnosis not present

## 2017-06-25 LAB — CBC WITH DIFFERENTIAL/PLATELET
BASOS ABS: 0 10*3/uL (ref 0.0–0.1)
Basophils Relative: 0.8 % (ref 0.0–3.0)
EOS ABS: 0.2 10*3/uL (ref 0.0–0.7)
Eosinophils Relative: 4.4 % (ref 0.0–5.0)
HEMATOCRIT: 43.1 % (ref 39.0–52.0)
HEMOGLOBIN: 15.4 g/dL (ref 13.0–17.0)
LYMPHS PCT: 27.1 % (ref 12.0–46.0)
Lymphs Abs: 1.5 10*3/uL (ref 0.7–4.0)
MCHC: 35.7 g/dL (ref 30.0–36.0)
MCV: 86.2 fl (ref 78.0–100.0)
MONOS PCT: 6.5 % (ref 3.0–12.0)
Monocytes Absolute: 0.4 10*3/uL (ref 0.1–1.0)
Neutro Abs: 3.4 10*3/uL (ref 1.4–7.7)
Neutrophils Relative %: 61.2 % (ref 43.0–77.0)
Platelets: 163 10*3/uL (ref 150.0–400.0)
RBC: 5 Mil/uL (ref 4.22–5.81)
RDW: 13.6 % (ref 11.5–15.5)
WBC: 5.5 10*3/uL (ref 4.0–10.5)

## 2017-06-25 LAB — LIPID PANEL
CHOL/HDL RATIO: 4
Cholesterol: 173 mg/dL (ref 0–200)
HDL: 46.5 mg/dL (ref >39.00)
LDL CALC: 99 mg/dL (ref 0–99)
NONHDL: 126.19
TRIGLYCERIDES: 135 mg/dL (ref 0.0–149.0)
VLDL: 27 mg/dL (ref 0.0–40.0)

## 2017-06-25 LAB — HEPATIC FUNCTION PANEL
ALBUMIN: 4.1 g/dL (ref 3.5–5.2)
ALT: 23 U/L (ref 0–53)
AST: 15 U/L (ref 0–37)
Alkaline Phosphatase: 56 U/L (ref 39–117)
BILIRUBIN DIRECT: 0.1 mg/dL (ref 0.0–0.3)
TOTAL PROTEIN: 6.8 g/dL (ref 6.0–8.3)
Total Bilirubin: 0.6 mg/dL (ref 0.2–1.2)

## 2017-06-25 LAB — URINALYSIS
Bilirubin Urine: NEGATIVE
KETONES UR: NEGATIVE
Leukocytes, UA: NEGATIVE
Nitrite: NEGATIVE
Specific Gravity, Urine: 1.015 (ref 1.000–1.030)
TOTAL PROTEIN, URINE-UPE24: NEGATIVE
URINE GLUCOSE: NEGATIVE
UROBILINOGEN UA: 0.2 (ref 0.0–1.0)
pH: 7 (ref 5.0–8.0)

## 2017-06-25 LAB — BASIC METABOLIC PANEL
BUN: 15 mg/dL (ref 6–23)
CHLORIDE: 103 meq/L (ref 96–112)
CO2: 29 meq/L (ref 19–32)
CREATININE: 0.93 mg/dL (ref 0.40–1.50)
Calcium: 9.1 mg/dL (ref 8.4–10.5)
GFR: 86.12 mL/min (ref >60.00)
GLUCOSE: 145 mg/dL — AB (ref 70–99)
Potassium: 4.2 mEq/L (ref 3.5–5.1)
SODIUM: 139 meq/L (ref 135–145)

## 2017-06-25 LAB — HEMOGLOBIN A1C: Hgb A1c MFr Bld: 6.1 % (ref 4.6–6.5)

## 2017-06-25 LAB — PSA: PSA: 11.86 ng/mL — ABNORMAL HIGH (ref 0.10–4.00)

## 2017-06-25 LAB — TSH: TSH: 1.58 u[IU]/mL (ref 0.35–4.50)

## 2017-06-27 ENCOUNTER — Ambulatory Visit (INDEPENDENT_AMBULATORY_CARE_PROVIDER_SITE_OTHER): Payer: Medicare Other | Admitting: Internal Medicine

## 2017-06-27 ENCOUNTER — Encounter: Payer: Self-pay | Admitting: Internal Medicine

## 2017-06-27 VITALS — BP 124/76 | HR 72 | Temp 98.4°F | Ht 68.0 in | Wt 255.0 lb

## 2017-06-27 DIAGNOSIS — Z0000 Encounter for general adult medical examination without abnormal findings: Secondary | ICD-10-CM | POA: Diagnosis not present

## 2017-06-27 DIAGNOSIS — R350 Frequency of micturition: Secondary | ICD-10-CM

## 2017-06-27 DIAGNOSIS — Z1211 Encounter for screening for malignant neoplasm of colon: Secondary | ICD-10-CM | POA: Diagnosis not present

## 2017-06-27 DIAGNOSIS — N401 Enlarged prostate with lower urinary tract symptoms: Secondary | ICD-10-CM

## 2017-06-27 DIAGNOSIS — R739 Hyperglycemia, unspecified: Secondary | ICD-10-CM

## 2017-06-27 DIAGNOSIS — R972 Elevated prostate specific antigen [PSA]: Secondary | ICD-10-CM

## 2017-06-27 MED ORDER — FINASTERIDE 5 MG PO TABS: 5.0000 mg | ORAL_TABLET | Freq: Every day | ORAL | 3 refills | Status: DC

## 2017-06-27 NOTE — Assessment & Plan Note (Signed)
PSA Proscar 

## 2017-06-27 NOTE — Assessment & Plan Note (Signed)
Labs

## 2017-06-27 NOTE — Assessment & Plan Note (Addendum)
Here for medicare wellness/physical  Diet: heart healthy  Physical activity: not sedentary  Depression/mood screen: negative  Hearing: intact to whispered voice  Visual acuity: grossly normal, performs annual eye exam  ADLs: capable  Fall risk: low to none  Home safety: good  Cognitive evaluation: intact to orientation, naming, recall and repetition  EOL planning: adv directives, full code/ I agree  I have personally reviewed and have noted  1. The patient's medical, surgical and social history  2. Their use of alcohol, tobacco or illicit drugs  3. Their current medications and supplements  4. The patient's functional ability including ADL's, fall risks, home safety risks and hearing or visual impairment.  5. Diet and physical activities  6. Evidence for depression or mood disorders 7. The roster of all physicians providing medical care to patient - is listed in the Snapshot section of the chart and reviewed today.    Today patient counseled on age appropriate routine health concerns for screening and prevention, each reviewed and up to date or declined. Immunizations reviewed and up to date or declined. Labs ordered and reviewed. Risk factors for depression reviewed and negative. Hearing function and visual acuity are intact. ADLs screened and addressed as needed. Functional ability and level of safety reviewed and appropriate. Education, counseling and referrals performed based on assessed risks today. Patient provided with a copy of personalized plan for preventive services.   cologuard ordered

## 2017-06-27 NOTE — Progress Notes (Signed)
Subjective:  Patient ID: Drew Hansen, male    DOB: 10/06/1950  Age: 67 y.o. MRN: [id number]  CC: No chief complaint on file.   HPI Drew Hansen presents for a well exam/ [facility] well  Outpatient Medications Prior to Visit  Medication Sig Dispense Refill  . aspirin 81 MG EC tablet Take 81 mg by mouth daily.      . cholecalciferol (VITAMIN D) 1000 units tablet Take 2 tablets (2,000 Units total) by mouth daily. 100 tablet 5  . ergocalciferol (VITAMIN D2) 50000 units capsule Take 1 capsule (50,000 Units total) by mouth once a week. 6 capsule 0  . ibuprofen (ADVIL,MOTRIN) 600 MG tablet Take 600 mg by mouth 2 (two) times daily as needed.       No facility-administered medications prior to visit.     ROS Review of Systems  Constitutional: Negative for appetite change, fatigue and unexpected weight change.  HENT: Negative for congestion, nosebleeds, sneezing, sore throat and trouble swallowing.   Eyes: Negative for itching and visual disturbance.  Respiratory: Negative for cough.   Cardiovascular: Negative for chest pain, palpitations and leg swelling.  Gastrointestinal: Negative for abdominal distention, blood in stool, diarrhea and nausea.  Genitourinary: Negative for frequency and hematuria.  Musculoskeletal: Negative for back pain, gait problem, joint swelling and neck pain.  Skin: Negative for rash.  Neurological: Negative for dizziness, tremors, speech difficulty and weakness.  Psychiatric/Behavioral: Negative for agitation, dysphoric mood and sleep disturbance. The patient is not nervous/anxious.     Objective:  BP 124/76 (BP Location: Left Arm, Patient Position: Sitting, Cuff Size: Large)   Pulse 72   Temp 98.4 F (36.9 C) (Oral)   Ht 5\' 8"  (1.727 m)   Wt 255 lb (115.7 kg)   SpO2 98%   BMI 38.77 kg/m   BP Readings from Last 3 Encounters:  06/27/17 124/76  01/03/17 122/80  06/22/16 122/76    Wt Readings from Last 3 Encounters:  06/27/17 255 lb (115.7 kg)    01/03/17 243 lb (110.2 kg)  06/22/16 248 lb (112.5 kg)    Physical Exam  Constitutional: He is oriented to person, place, and time. He appears well-developed. No distress.  NAD  HENT:  Mouth/Throat: Oropharynx is clear and moist.  Eyes: Conjunctivae are normal. Pupils are equal, round, and reactive to light.  Neck: Normal range of motion. No JVD present. No thyromegaly present.  Cardiovascular: Normal rate, regular rhythm, normal heart sounds and intact distal pulses. Exam reveals no gallop and no friction rub.  No murmur heard. Pulmonary/Chest: Effort normal and breath sounds normal. No respiratory distress. He has no wheezes. He has no rales. He exhibits no tenderness.  Abdominal: Soft. Bowel sounds are normal. He exhibits no distension and no mass. There is no tenderness. There is no rebound and no guarding.  Genitourinary: Rectum normal. Rectal exam shows guaiac negative stool.  Musculoskeletal: Normal range of motion. He exhibits no edema or tenderness.  Lymphadenopathy:    He has no cervical adenopathy.  Neurological: He is alert and oriented to person, place, and time. He has normal reflexes. No cranial nerve deficit. He exhibits normal muscle tone. He displays a negative Romberg sign. Coordination and gait normal.  Skin: Skin is warm and dry. No rash noted.  Psychiatric: He has a normal mood and affect. His behavior is normal. Judgment and thought content normal.  prostate 1+  Lab Results  Component Value Date   WBC 5.5 06/25/2017   HGB 15.4 06/25/2017  HCT 43.1 06/25/2017   PLT 163.0 06/25/2017   GLUCOSE 145 (H) 06/25/2017   CHOL 173 06/25/2017   TRIG 135.0 06/25/2017   HDL 46.50 06/25/2017   LDLDIRECT 102.0 06/20/2016   LDLCALC 99 06/25/2017   ALT 23 06/25/2017   AST 15 06/25/2017   NA 139 06/25/2017   K 4.2 06/25/2017   CL 103 06/25/2017   CREATININE 0.93 06/25/2017   BUN 15 06/25/2017   CO2 29 06/25/2017   TSH 1.58 06/25/2017   PSA 11.86 (H) 06/25/2017    HGBA1C 6.1 06/25/2017    Dg Chest 2 View  Result Date: 06/22/2015 CLINICAL DATA:  Followup pneumonia. No current complaints. Ex smoker. EXAM: CHEST  2 VIEW COMPARISON:  06/24/2014. FINDINGS: No evidence of pneumonia. Mild interstitial prominence is noted bilaterally. There are prominent bronchovascular markings in the lower lobes. These findings are stable. No evidence of pulmonary edema. No pleural effusion or pneumothorax. Cardiac silhouette is normal in size. No mediastinal or hilar masses or evidence of adenopathy. Bony thorax is intact. IMPRESSION: 1. No acute cardiopulmonary disease.  No evidence of pneumonia. Electronically Signed   By: Amie Portland M.D.   On: 06/22/2015 14:55    Assessment & Plan:   There are no diagnoses linked to this encounter. I am having Drew Hansen maintain his aspirin, ibuprofen, ergocalciferol, and cholecalciferol.  No orders of the defined types were placed in this encounter.    Follow-up: No Follow-up on file.  Sonda Primes, MD

## 2017-06-27 NOTE — Patient Instructions (Addendum)
Saw palmetto or Proscar (Finesteride) Health Maintenance, Male A healthy lifestyle and preventive care is important for your health and wellness. Ask your health care provider about what schedule of regular examinations is right for you. What should I know about weight and diet? Eat a Healthy Diet  Eat plenty of vegetables, fruits, whole grains, low-fat dairy products, and lean protein.  Do not eat a lot of foods high in solid fats, added sugars, or salt.  Maintain a Healthy Weight Regular exercise can help you achieve or maintain a healthy weight. You should:  Do at least 150 minutes of exercise each week. The exercise should increase your heart rate and make you sweat (moderate-intensity exercise).  Do strength-training exercises at least twice a week.  Watch Your Levels of Cholesterol and Blood Lipids  Have your blood tested for lipids and cholesterol every 5 years starting at 67 years of age. If you are at high risk for heart disease, you should start having your blood tested when you are 67 years old. You may need to have your cholesterol levels checked more often if: ? Your lipid or cholesterol levels are high. ? You are older than 67 years of age. ? You are at high risk for heart disease.  What should I know about cancer screening? Many types of cancers can be detected early and may often be prevented. Lung Cancer  You should be screened every year for lung cancer if: ? You are a current smoker who has smoked for at least 30 years. ? You are a former smoker who has quit within the past 15 years.  Talk to your health care provider about your screening options, when you should start screening, and how often you should be screened.  Colorectal Cancer  Routine colorectal cancer screening usually begins at 67 years of age and should be repeated every 5-10 years until you are 67 years old. You may need to be screened more often if early forms of precancerous polyps or small  growths are found. Your health care provider may recommend screening at an earlier age if you have risk factors for colon cancer.  Your health care provider may recommend using home test kits to check for hidden blood in the stool.  A small camera at the end of a tube can be used to examine your colon (sigmoidoscopy or colonoscopy). This checks for the earliest forms of colorectal cancer.  Prostate and Testicular Cancer  Depending on your age and overall health, your health care provider may do certain tests to screen for prostate and testicular cancer.  Talk to your health care provider about any symptoms or concerns you have about testicular or prostate cancer.  Skin Cancer  Check your skin from head to toe regularly.  Tell your health care provider about any new moles or changes in moles, especially if: ? There is a change in a mole's size, shape, or color. ? You have a mole that is larger than a pencil eraser.  Always use sunscreen. Apply sunscreen liberally and repeat throughout the day.  Protect yourself by wearing long sleeves, pants, a wide-brimmed hat, and sunglasses when outside.  What should I know about heart disease, diabetes, and high blood pressure?  If you are 518-67 years of age, have your blood pressure checked every 3-5 years. If you are 67 years of age or older, have your blood pressure checked every year. You should have your blood pressure measured twice-once when you are at a hospital  or clinic, and once when you are not at a hospital or clinic. Record the average of the two measurements. To check your blood pressure when you are not at a hospital or clinic, you can use: ? An automated blood pressure machine at a pharmacy. ? A home blood pressure monitor.  Talk to your health care provider about your target blood pressure.  If you are between 62-48 years old, ask your health care provider if you should take aspirin to prevent heart disease.  Have regular  diabetes screenings by checking your fasting blood sugar level. ? If you are at a normal weight and have a low risk for diabetes, have this test once every three years after the age of 31. ? If you are overweight and have a high risk for diabetes, consider being tested at a younger age or more often.  A one-time screening for abdominal aortic aneurysm (AAA) by ultrasound is recommended for men aged 35-75 years who are current or former smokers. What should I know about preventing infection? Hepatitis B If you have a higher risk for hepatitis B, you should be screened for this virus. Talk with your health care provider to find out if you are at risk for hepatitis B infection. Hepatitis C Blood testing is recommended for:  Everyone born from [birth year] through 1965.  Anyone with known risk factors for hepatitis C.  Sexually Transmitted Diseases (STDs)  You should be screened each year for STDs including gonorrhea and chlamydia if: ? You are sexually active and are younger than 67 years of age. ? You are older than 67 years of age and your health care provider tells you that you are at risk for this type of infection. ? Your sexual activity has changed since you were last screened and you are at an increased risk for chlamydia or gonorrhea. Ask your health care provider if you are at risk.  Talk with your health care provider about whether you are at high risk of being infected with HIV. Your health care provider may recommend a prescription medicine to help prevent HIV infection.  What else can I do?  Schedule regular health, dental, and eye exams.  Stay current with your vaccines (immunizations).  Do not use any tobacco products, such as cigarettes, chewing tobacco, and e-cigarettes. If you need help quitting, ask your health care provider.  Limit alcohol intake to no more than 2 drinks per day. One drink equals 12 ounces of beer, 5 ounces of wine, or 1 ounces of hard liquor.  Do not use  street drugs.  Do not share needles.  Ask your health care provider for help if you need support or information about quitting drugs.  Tell your health care provider if you often feel depressed.  Tell your health care provider if you have ever been abused or do not feel safe at home. This information is not intended to replace advice given to you by your health care provider. Make sure you discuss any questions you have with your health care provider. Document Released: 11/04/2007 Document Revised: 01/05/2016 Document Reviewed: 02/09/2015 Elsevier Interactive Patient Education  [employer].

## 2017-07-02 LAB — COLOGUARD

## 2017-07-09 LAB — COLOGUARD: COLOGUARD: NEGATIVE

## 2017-07-19 ENCOUNTER — Encounter: Payer: Self-pay | Admitting: Internal Medicine

## 2017-07-19 NOTE — Progress Notes (Signed)
Outside notes received. Information abstracted. Notes sent to scan.  

## 2017-07-21 ENCOUNTER — Encounter: Payer: Self-pay | Admitting: Internal Medicine

## 2017-09-29 ENCOUNTER — Encounter: Payer: Self-pay | Admitting: Internal Medicine

## 2018-01-03 ENCOUNTER — Ambulatory Visit: Payer: Medicare Other | Admitting: Internal Medicine

## 2018-01-03 DIAGNOSIS — Z0289 Encounter for other administrative examinations: Secondary | ICD-10-CM

## 2018-04-01 ENCOUNTER — Other Ambulatory Visit (INDEPENDENT_AMBULATORY_CARE_PROVIDER_SITE_OTHER): Payer: Medicare Other

## 2018-04-01 DIAGNOSIS — R739 Hyperglycemia, unspecified: Secondary | ICD-10-CM

## 2018-04-01 DIAGNOSIS — R350 Frequency of micturition: Secondary | ICD-10-CM | POA: Diagnosis not present

## 2018-04-01 DIAGNOSIS — R972 Elevated prostate specific antigen [PSA]: Secondary | ICD-10-CM

## 2018-04-01 DIAGNOSIS — N401 Enlarged prostate with lower urinary tract symptoms: Secondary | ICD-10-CM

## 2018-04-01 LAB — BASIC METABOLIC PANEL
BUN: 9 mg/dL (ref 6–23)
CALCIUM: 9.5 mg/dL (ref 8.4–10.5)
CO2: 31 mEq/L (ref 19–32)
CREATININE: 0.96 mg/dL (ref 0.40–1.50)
Chloride: 104 mEq/L (ref 96–112)
GFR: 82.83 mL/min (ref >60.00)
GLUCOSE: 150 mg/dL — AB (ref 70–99)
POTASSIUM: 5.3 meq/L — AB (ref 3.5–5.1)
SODIUM: 140 meq/L (ref 135–145)

## 2018-04-01 LAB — PSA: PSA: 10.97 ng/mL — AB (ref 0.10–4.00)

## 2018-04-01 LAB — HEMOGLOBIN A1C: HEMOGLOBIN A1C: 6.4 % (ref 4.6–6.5)

## 2018-04-04 ENCOUNTER — Encounter: Payer: Self-pay | Admitting: Internal Medicine

## 2018-04-04 ENCOUNTER — Ambulatory Visit: Payer: Medicare Other | Admitting: Internal Medicine

## 2018-04-04 VITALS — BP 132/82 | HR 64 | Temp 98.0°F | Ht 68.0 in | Wt 257.0 lb

## 2018-04-04 DIAGNOSIS — N401 Enlarged prostate with lower urinary tract symptoms: Secondary | ICD-10-CM

## 2018-04-04 DIAGNOSIS — R0683 Snoring: Secondary | ICD-10-CM

## 2018-04-04 DIAGNOSIS — R972 Elevated prostate specific antigen [PSA]: Secondary | ICD-10-CM | POA: Diagnosis not present

## 2018-04-04 DIAGNOSIS — Z23 Encounter for immunization: Secondary | ICD-10-CM | POA: Diagnosis not present

## 2018-04-04 DIAGNOSIS — R739 Hyperglycemia, unspecified: Secondary | ICD-10-CM

## 2018-04-04 DIAGNOSIS — R350 Frequency of micturition: Secondary | ICD-10-CM

## 2018-04-04 DIAGNOSIS — E559 Vitamin D deficiency, unspecified: Secondary | ICD-10-CM

## 2018-04-04 MED ORDER — CLOTRIMAZOLE-BETAMETHASONE 1-0.05 % EX CREA: 1.0000 "application " | TOPICAL_CREAM | Freq: Two times a day (BID) | CUTANEOUS | 1 refills | Status: AC

## 2018-04-04 NOTE — Progress Notes (Signed)
Subjective:  Patient ID: Drew Hansen, male    DOB: 1950-08-18  Age: 67 y.o. MRN: [id number]  CC: No chief complaint on file.   HPI Drew Hansen presents for elevatred BP - SBP was 180 in [place]. He had a sleep apnea test (-)  He was taking [facility] rx for BP F/u elevated PSA CT ca scoring info  Outpatient Medications Prior to Visit  Medication Sig Dispense Refill  . cholecalciferol (VITAMIN D) 1000 units tablet Take 2 tablets (2,000 Units total) by mouth daily. 100 tablet 5  . ibuprofen (ADVIL,MOTRIN) 600 MG tablet Take 600 mg by mouth 2 (two) times daily as needed.      Marland Kitchen aspirin 81 MG EC tablet Take 81 mg by mouth daily.      . finasteride (PROSCAR) 5 MG tablet Take 1 tablet (5 mg total) by mouth daily. 90 tablet 3   No facility-administered medications prior to visit.     ROS: Review of Systems  Constitutional: Negative for appetite change, fatigue and unexpected weight change.  HENT: Negative for congestion, nosebleeds, sneezing, sore throat and trouble swallowing.   Eyes: Negative for itching and visual disturbance.  Respiratory: Negative for cough.   Cardiovascular: Negative for chest pain, palpitations and leg swelling.  Gastrointestinal: Negative for abdominal distention, blood in stool, diarrhea and nausea.  Genitourinary: Negative for frequency and hematuria.  Musculoskeletal: Negative for back pain, gait problem, joint swelling and neck pain.  Skin: Negative for rash.  Neurological: Negative for dizziness, tremors, speech difficulty and weakness.  Psychiatric/Behavioral: Negative for agitation, dysphoric mood, sleep disturbance and suicidal ideas. The patient is not nervous/anxious.     Objective:  BP 132/82 (BP Location: Left Arm, Patient Position: Sitting, Cuff Size: Large)   Pulse 64   Temp 98 F (36.7 C) (Oral)   Ht 5\' 8"  (1.727 m)   Wt 257 lb (116.6 kg)   SpO2 95%   BMI 39.08 kg/m   BP Readings from Last 3 Encounters:  04/04/18 132/82    06/27/17 124/76  01/03/17 122/80    Wt Readings from Last 3 Encounters:  04/04/18 257 lb (116.6 kg)  06/27/17 255 lb (115.7 kg)  01/03/17 243 lb (110.2 kg)    Physical Exam  Constitutional: He is oriented to person, place, and time. He appears well-developed. No distress.  NAD  HENT:  Mouth/Throat: Oropharynx is clear and moist.  Eyes: Pupils are equal, round, and reactive to light. Conjunctivae are normal.  Neck: Normal range of motion. No JVD present. No thyromegaly present.  Cardiovascular: Normal rate, regular rhythm, normal heart sounds and intact distal pulses. Exam reveals no gallop and no friction rub.  No murmur heard. Pulmonary/Chest: Effort normal and breath sounds normal. No respiratory distress. He has no wheezes. He has no rales. He exhibits no tenderness.  Abdominal: Soft. Bowel sounds are normal. He exhibits no distension and no mass. There is no tenderness. There is no rebound and no guarding.  Musculoskeletal: Normal range of motion. He exhibits no edema or tenderness.  Lymphadenopathy:    He has no cervical adenopathy.  Neurological: He is alert and oriented to person, place, and time. He has normal reflexes. No cranial nerve deficit. He exhibits normal muscle tone. He displays a negative Romberg sign. Coordination and gait normal.  Skin: Skin is warm and dry. No rash noted.  Psychiatric: He has a normal mood and affect. His behavior is normal. Judgment and thought content normal.    Lab Results  Component Value  Date   WBC 5.5 06/25/2017   HGB 15.4 06/25/2017   HCT 43.1 06/25/2017   PLT 163.0 06/25/2017   GLUCOSE 150 (H) 04/01/2018   CHOL 173 06/25/2017   TRIG 135.0 06/25/2017   HDL 46.50 06/25/2017   LDLDIRECT 102.0 06/20/2016   LDLCALC 99 06/25/2017   ALT 23 06/25/2017   AST 15 06/25/2017   NA 140 04/01/2018   K 5.3 (H) 04/01/2018   CL 104 04/01/2018   CREATININE 0.96 04/01/2018   BUN 9 04/01/2018   CO2 31 04/01/2018   TSH 1.58 06/25/2017   PSA  10.97 (H) 04/01/2018   HGBA1C 6.4 04/01/2018    Dg Chest 2 View  Result Date: 06/22/2015 CLINICAL DATA:  Followup pneumonia. No current complaints. Ex smoker. EXAM: CHEST  2 VIEW COMPARISON:  06/24/2014. FINDINGS: No evidence of pneumonia. Mild interstitial prominence is noted bilaterally. There are prominent bronchovascular markings in the lower lobes. These findings are stable. No evidence of pulmonary edema. No pleural effusion or pneumothorax. Cardiac silhouette is normal in size. No mediastinal or hilar masses or evidence of adenopathy. Bony thorax is intact. IMPRESSION: 1. No acute cardiopulmonary disease.  No evidence of pneumonia. Electronically Signed   By: Amie Portlandavid  Ormond M.D.   On: 06/22/2015 14:55    Assessment & Plan:   There are no diagnoses linked to this encounter.   No orders of the defined types were placed in this encounter.    Follow-up: No follow-ups on file.  Sonda PrimesAlex Alexio Sroka, MD

## 2018-04-04 NOTE — Assessment & Plan Note (Signed)
Deviated septum He had a sleep apnea test (-) in [place] 2019

## 2018-04-04 NOTE — Patient Instructions (Signed)

## 2018-04-04 NOTE — Assessment & Plan Note (Signed)
PSA

## 2018-04-04 NOTE — Assessment & Plan Note (Signed)
Vit D 

## 2018-04-04 NOTE — Assessment & Plan Note (Signed)
Chronic. 

## 2018-04-26 ENCOUNTER — Ambulatory Visit: Payer: Medicare Other

## 2018-08-20 ENCOUNTER — Telehealth: Payer: Self-pay | Admitting: Internal Medicine

## 2018-09-05 ENCOUNTER — Ambulatory Visit: Payer: Medicare Other | Admitting: Internal Medicine

## 2018-09-05 ENCOUNTER — Encounter: Payer: Self-pay | Admitting: Internal Medicine

## 2018-10-16 ENCOUNTER — Telehealth: Payer: Self-pay | Admitting: Internal Medicine

## 2018-10-16 NOTE — Telephone Encounter (Signed)
Patient has appt on 6/1.  Patient is requesting Dr. Posey Rea to enter labs before appt.

## 2018-10-16 NOTE — Telephone Encounter (Signed)
error 

## 2018-10-17 NOTE — Telephone Encounter (Signed)
Lab are already entered into chart

## 2018-10-21 ENCOUNTER — Ambulatory Visit (INDEPENDENT_AMBULATORY_CARE_PROVIDER_SITE_OTHER): Payer: Medicare Other | Admitting: Internal Medicine

## 2018-10-21 ENCOUNTER — Encounter: Payer: Self-pay | Admitting: Internal Medicine

## 2018-10-21 ENCOUNTER — Other Ambulatory Visit (INDEPENDENT_AMBULATORY_CARE_PROVIDER_SITE_OTHER): Payer: Medicare Other

## 2018-10-21 ENCOUNTER — Other Ambulatory Visit: Payer: Self-pay

## 2018-10-21 DIAGNOSIS — I872 Venous insufficiency (chronic) (peripheral): Secondary | ICD-10-CM

## 2018-10-21 DIAGNOSIS — N401 Enlarged prostate with lower urinary tract symptoms: Secondary | ICD-10-CM | POA: Diagnosis not present

## 2018-10-21 DIAGNOSIS — R350 Frequency of micturition: Secondary | ICD-10-CM

## 2018-10-21 DIAGNOSIS — R0683 Snoring: Secondary | ICD-10-CM

## 2018-10-21 DIAGNOSIS — R972 Elevated prostate specific antigen [PSA]: Secondary | ICD-10-CM | POA: Diagnosis not present

## 2018-10-21 DIAGNOSIS — R739 Hyperglycemia, unspecified: Secondary | ICD-10-CM | POA: Diagnosis not present

## 2018-10-21 DIAGNOSIS — E559 Vitamin D deficiency, unspecified: Secondary | ICD-10-CM | POA: Diagnosis not present

## 2018-10-21 DIAGNOSIS — R0609 Other forms of dyspnea: Secondary | ICD-10-CM

## 2018-10-21 DIAGNOSIS — I10 Essential (primary) hypertension: Secondary | ICD-10-CM

## 2018-10-21 LAB — URINALYSIS
Bilirubin Urine: NEGATIVE
Ketones, ur: NEGATIVE
Leukocytes,Ua: NEGATIVE
Nitrite: NEGATIVE
Specific Gravity, Urine: 1.025 (ref 1.000–1.030)
Urine Glucose: NEGATIVE
Urobilinogen, UA: 0.2 (ref 0.0–1.0)
pH: 5.5 (ref 5.0–8.0)

## 2018-10-21 LAB — CBC WITH DIFFERENTIAL/PLATELET
Basophils Absolute: 0.1 10*3/uL (ref 0.0–0.1)
Basophils Relative: 0.9 % (ref 0.0–3.0)
Eosinophils Absolute: 0.1 10*3/uL (ref 0.0–0.7)
Eosinophils Relative: 2.2 % (ref 0.0–5.0)
HCT: 44.5 % (ref 39.0–52.0)
Hemoglobin: 15.9 g/dL (ref 13.0–17.0)
Lymphocytes Relative: 27.3 % (ref 12.0–46.0)
Lymphs Abs: 1.5 10*3/uL (ref 0.7–4.0)
MCHC: 35.6 g/dL (ref 30.0–36.0)
MCV: 87.9 fl (ref 78.0–100.0)
Monocytes Absolute: 0.4 10*3/uL (ref 0.1–1.0)
Monocytes Relative: 7.1 % (ref 3.0–12.0)
Neutro Abs: 3.4 10*3/uL (ref 1.4–7.7)
Neutrophils Relative %: 62.5 % (ref 43.0–77.0)
Platelets: 192 10*3/uL (ref 150.0–400.0)
RBC: 5.07 Mil/uL (ref 4.22–5.81)
RDW: 13.5 % (ref 11.5–15.5)
WBC: 5.5 10*3/uL (ref 4.0–10.5)

## 2018-10-21 LAB — BASIC METABOLIC PANEL
BUN: 11 mg/dL (ref 6–23)
CO2: 28 mEq/L (ref 19–32)
Calcium: 9.6 mg/dL (ref 8.4–10.5)
Chloride: 103 mEq/L (ref 96–112)
Creatinine, Ser: 1.01 mg/dL (ref 0.40–1.50)
GFR: 73.37 mL/min (ref >60.00)
Glucose, Bld: 178 mg/dL — ABNORMAL HIGH (ref 70–99)
Potassium: 4.8 mEq/L (ref 3.5–5.1)
Sodium: 139 mEq/L (ref 135–145)

## 2018-10-21 LAB — HEPATIC FUNCTION PANEL
ALT: 44 U/L (ref 0–53)
AST: 30 U/L (ref 0–37)
Albumin: 4.4 g/dL (ref 3.5–5.2)
Alkaline Phosphatase: 60 U/L (ref 39–117)
Bilirubin, Direct: 0.2 mg/dL (ref 0.0–0.3)
Total Bilirubin: 0.9 mg/dL (ref 0.2–1.2)
Total Protein: 7.4 g/dL (ref 6.0–8.3)

## 2018-10-21 LAB — VITAMIN B12: Vitamin B-12: 233 pg/mL (ref 211–911)

## 2018-10-21 LAB — TSH: TSH: 1.26 u[IU]/mL (ref 0.35–4.50)

## 2018-10-21 LAB — BRAIN NATRIURETIC PEPTIDE: Pro B Natriuretic peptide (BNP): 12 pg/mL (ref 0.0–100.0)

## 2018-10-21 MED ORDER — HYDROCHLOROTHIAZIDE 12.5 MG PO CAPS: 12.5000 mg | ORAL_CAPSULE | Freq: Every day | ORAL | 11 refills | Status: DC

## 2018-10-21 NOTE — Assessment & Plan Note (Signed)
Vit D 

## 2018-10-21 NOTE — Assessment & Plan Note (Signed)
PSA

## 2018-10-21 NOTE — Progress Notes (Signed)
Subjective:  Patient ID: Drew Hansen, male    DOB: 05/12/1951  Age: 68 y.o. MRN: [id number]  CC: No chief complaint on file.   HPI Drew CritchleySamuel Hansen presents for 6 mo f/u elev PSA C/o "walking on pillows" sensation C/o less exercise tolerance, ?DOE Sleep apnea test was nl in [place] 2019  Outpatient Medications Prior to Visit  Medication Sig Dispense Refill  . cholecalciferol (VITAMIN D) 1000 units tablet Take 2 tablets (2,000 Units total) by mouth daily. 100 tablet 5  . clotrimazole-betamethasone (LOTRISONE) cream Apply 1 application topically 2 (two) times daily. 45 g 1  . ibuprofen (ADVIL,MOTRIN) 600 MG tablet Take 600 mg by mouth 2 (two) times daily as needed.       No facility-administered medications prior to visit.     ROS: Review of Systems  Constitutional: Negative for appetite change, fatigue and unexpected weight change.  HENT: Negative for congestion, nosebleeds, sneezing, sore throat and trouble swallowing.   Eyes: Negative for itching and visual disturbance.  Respiratory: Negative for cough.   Cardiovascular: Negative for chest pain, palpitations and leg swelling.  Gastrointestinal: Negative for abdominal distention, blood in stool, diarrhea and nausea.  Genitourinary: Negative for frequency and hematuria.  Musculoskeletal: Negative for back pain, gait problem, joint swelling and neck pain.  Skin: Negative for rash.  Neurological: Negative for dizziness, tremors, speech difficulty and weakness.  Psychiatric/Behavioral: Negative for agitation, dysphoric mood, sleep disturbance and suicidal ideas. The patient is not nervous/anxious.     Objective:  BP 140/82 (BP Location: Left Arm, Patient Position: Sitting, Cuff Size: Large)   Pulse 71   Temp 97.6 F (36.4 C) (Oral)   Ht 5\' 8"  (1.727 m)   Wt 257 lb (116.6 kg)   SpO2 95%   BMI 39.08 kg/m   BP Readings from Last 3 Encounters:  10/21/18 140/82  04/26/18 (!) 142/82  04/04/18 132/82    Wt Readings  from Last 3 Encounters:  10/21/18 257 lb (116.6 kg)  04/04/18 257 lb (116.6 kg)  06/27/17 255 lb (115.7 kg)    Physical Exam Constitutional:      General: He is not in acute distress.    Appearance: He is well-developed.     Comments: NAD  Eyes:     Conjunctiva/sclera: Conjunctivae normal.     Pupils: Pupils are equal, round, and reactive to light.  Neck:     Musculoskeletal: Normal range of motion.     Thyroid: No thyromegaly.     Vascular: No JVD.  Cardiovascular:     Rate and Rhythm: Normal rate and regular rhythm.     Heart sounds: Normal heart sounds. No murmur. No friction rub. No gallop.   Pulmonary:     Effort: Pulmonary effort is normal. No respiratory distress.     Breath sounds: Normal breath sounds. No wheezing or rales.  Chest:     Chest wall: No tenderness.  Abdominal:     General: Bowel sounds are normal. There is no distension.     Palpations: Abdomen is soft. There is no mass.     Tenderness: There is no abdominal tenderness. There is no guarding or rebound.  Musculoskeletal: Normal range of motion.        General: No tenderness.  Lymphadenopathy:     Cervical: No cervical adenopathy.  Skin:    General: Skin is warm and dry.     Findings: No rash.  Neurological:     Mental Status: He is alert and oriented to person,  place, and time.     Cranial Nerves: No cranial nerve deficit.     Motor: No abnormal muscle tone.     Coordination: Coordination normal.     Gait: Gait normal.     Deep Tendon Reflexes: Reflexes are normal and symmetric.  Psychiatric:        Behavior: Behavior normal.        Thought Content: Thought content normal.        Judgment: Judgment normal.   obese Trace edema B  Lab Results  Component Value Date   WBC 5.5 06/25/2017   HGB 15.4 06/25/2017   HCT 43.1 06/25/2017   PLT 163.0 06/25/2017   GLUCOSE 150 (H) 04/01/2018   CHOL 173 06/25/2017   TRIG 135.0 06/25/2017   HDL 46.50 06/25/2017   LDLDIRECT 102.0 06/20/2016   LDLCALC  99 06/25/2017   ALT 23 06/25/2017   AST 15 06/25/2017   NA 140 04/01/2018   K 5.3 (H) 04/01/2018   CL 104 04/01/2018   CREATININE 0.96 04/01/2018   BUN 9 04/01/2018   CO2 31 04/01/2018   TSH 1.58 06/25/2017   PSA 10.97 (H) 04/01/2018   HGBA1C 6.4 04/01/2018    Dg Chest 2 View  Result Date: 06/22/2015 CLINICAL DATA:  Followup pneumonia. No current complaints. Ex smoker. EXAM: CHEST  2 VIEW COMPARISON:  06/24/2014. FINDINGS: No evidence of pneumonia. Mild interstitial prominence is noted bilaterally. There are prominent bronchovascular markings in the lower lobes. These findings are stable. No evidence of pulmonary edema. No pleural effusion or pneumothorax. Cardiac silhouette is normal in size. No mediastinal or hilar masses or evidence of adenopathy. Bony thorax is intact. IMPRESSION: 1. No acute cardiopulmonary disease.  No evidence of pneumonia. Electronically Signed   By: Amie Portland M.D.   On: 06/22/2015 14:55    Assessment & Plan:   There are no diagnoses linked to this encounter.   No orders of the defined types were placed in this encounter.    Follow-up: No follow-ups on file.  Sonda Primes, MD

## 2018-10-21 NOTE — Assessment & Plan Note (Signed)
?  obesity vs other ECHO Labs

## 2018-10-21 NOTE — Assessment & Plan Note (Signed)
Sleep apnea test was nl in [place] 2019

## 2018-10-21 NOTE — Assessment & Plan Note (Signed)
Free PSA 

## 2018-10-21 NOTE — Assessment & Plan Note (Addendum)
Start Maxzide 

## 2018-10-21 NOTE — Assessment & Plan Note (Signed)
Labs

## 2018-10-21 NOTE — Assessment & Plan Note (Signed)
compr socks 

## 2018-10-21 NOTE — Patient Instructions (Addendum)
Compression support socks Chronic Venous Insufficiency Chronic venous insufficiency, also called venous stasis, is a condition that prevents blood from being pumped effectively through the veins in your legs. Blood may no longer be pumped effectively from the legs back to the heart. This condition can range from mild to severe. With proper treatment, you should be able to continue with an active life. What are the causes? Chronic venous insufficiency occurs when the vein walls become stretched, weakened, or damaged, or when valves within the vein are damaged. Some common causes of this include:  High blood pressure inside the veins (venous hypertension).  Increased blood pressure in the leg veins from long periods of sitting or standing.  A blood clot that blocks blood flow in a vein (deep vein thrombosis, DVT).  Inflammation of a vein (phlebitis) that causes a blood clot to form.  Tumors in the pelvis that cause blood to back up. What increases the risk? The following factors may make you more likely to develop this condition:  Having a family history of this condition.  Obesity.  Pregnancy.  Living without enough physical activity or exercise (sedentary lifestyle).  Smoking.  Having a job that requires long periods of standing or sitting in one place.  Being a certain age. Women in their 27s and 61s and men in their 15s are more likely to develop this condition. What are the signs or symptoms? Symptoms of this condition include:  Veins that are enlarged, bulging, or twisted (varicose veins).  Skin breakdown or ulcers.  Reddened or discolored skin on the front of the leg.  Brown, smooth, tight, and painful skin just above the ankle, usually on the inside of the leg (lipodermatosclerosis).  Swelling. How is this diagnosed? This condition may be diagnosed based on:  Your medical history.  A physical exam.  Tests, such as: ? A procedure that creates an image of a  blood vessel and nearby organs and provides information about blood flow through the blood vessel (duplex ultrasound). ? A procedure that tests blood flow (plethysmography). ? A procedure to look at the veins using X-ray and dye (venogram). How is this treated? The goals of treatment are to help you return to an active life and to minimize pain or disability. Treatment depends on the severity of your condition, and it may include:  Wearing compression stockings. These can help relieve symptoms and help prevent your condition from getting worse. However, they do not cure the condition.  Sclerotherapy. This is a procedure involving an injection of a material that "dissolves" damaged veins.  Surgery. This may involve: ? Removing a diseased vein (vein stripping). ? Cutting off blood flow through the vein (laser ablation surgery). ? Repairing a valve. Follow these instructions at home:      Wear compression stockings as told by your health care provider. These stockings help to prevent blood clots and reduce swelling in your legs.  Take over-the-counter and prescription medicines only as told by your health care provider.  Stay active by exercising, walking, or doing different activities. Ask your health care provider what activities are safe for you and how much exercise you need.  Drink enough fluid to keep your urine clear or pale yellow.  Do not use any products that contain nicotine or tobacco, such as cigarettes and e-cigarettes. If you need help quitting, ask your health care provider.  Keep all follow-up visits as told by your health care provider. This is important. Contact a health care provider if:  You have redness, swelling, or more pain in the affected area.  You see a red streak or line that extends up or down from the affected area.  You have skin breakdown or a loss of skin in the affected area, even if the breakdown is small.  You get an injury in the affected  area. Get help right away if:  You get an injury and an open wound in the affected area.  You have severe pain that does not get better with medicine.  You have sudden numbness or weakness in the foot or ankle below the affected area, or you have trouble moving your foot or ankle.  You have a fever and you have worse or persistent symptoms.  You have chest pain.  You have shortness of breath. Summary  Chronic venous insufficiency, also called venous stasis, is a condition that prevents blood from being pumped effectively through the veins in your legs.  Chronic venous insufficiency occurs when the vein walls become stretched, weakened, or damaged, or when valves within the vein are damaged.  Treatment for this condition depends on how severe your condition is, and it may involve wearing compression stockings or having a procedure.  Make sure you stay active by exercising, walking, or doing different activities. Ask your health care provider what activities are safe for you and how much exercise you need. This information is not intended to replace advice given to you by your health care provider. Make sure you discuss any questions you have with your health care provider. Document Released: 09/11/2006 Document Revised: 03/27/2016 Document Reviewed: 03/27/2016 Elsevier Interactive Patient Education  2019 [employer]

## 2018-10-22 LAB — PSA, TOTAL AND FREE
PSA, Free: 1.6 ng/mL
PSA, Total: 15.8 ng/mL — ABNORMAL HIGH (ref <=4.0)

## 2018-10-23 ENCOUNTER — Other Ambulatory Visit (INDEPENDENT_AMBULATORY_CARE_PROVIDER_SITE_OTHER): Payer: Medicare Other

## 2018-10-23 ENCOUNTER — Other Ambulatory Visit: Payer: Self-pay | Admitting: Internal Medicine

## 2018-10-23 DIAGNOSIS — R739 Hyperglycemia, unspecified: Secondary | ICD-10-CM | POA: Diagnosis not present

## 2018-10-23 LAB — HEMOGLOBIN A1C: Hgb A1c MFr Bld: 7.3 % — ABNORMAL HIGH (ref 4.6–6.5)

## 2018-10-23 MED ORDER — METFORMIN HCL 500 MG PO TABS: 500.0000 mg | ORAL_TABLET | Freq: Every day | ORAL | 3 refills | Status: DC

## 2018-10-28 NOTE — Telephone Encounter (Signed)
Per pt chart no EGD has been ordered. Pls advise if test need to be done.Marland KitchenJohny Hansen

## 2018-11-01 ENCOUNTER — Telehealth (HOSPITAL_COMMUNITY): Payer: Self-pay | Admitting: Radiology

## 2018-11-01 NOTE — Telephone Encounter (Signed)
Left message to call office-Need to give instructions for echocardiogram.

## 2018-11-04 ENCOUNTER — Ambulatory Visit (HOSPITAL_COMMUNITY): Payer: Medicare Other | Attending: Cardiovascular Disease

## 2018-11-04 ENCOUNTER — Other Ambulatory Visit: Payer: Self-pay

## 2018-11-04 DIAGNOSIS — R0609 Other forms of dyspnea: Secondary | ICD-10-CM | POA: Insufficient documentation

## 2018-12-02 ENCOUNTER — Other Ambulatory Visit (INDEPENDENT_AMBULATORY_CARE_PROVIDER_SITE_OTHER): Payer: Medicare Other

## 2018-12-02 ENCOUNTER — Other Ambulatory Visit: Payer: Self-pay

## 2018-12-02 ENCOUNTER — Ambulatory Visit (INDEPENDENT_AMBULATORY_CARE_PROVIDER_SITE_OTHER): Payer: Medicare Other | Admitting: Internal Medicine

## 2018-12-02 ENCOUNTER — Encounter: Payer: Self-pay | Admitting: Internal Medicine

## 2018-12-02 DIAGNOSIS — E559 Vitamin D deficiency, unspecified: Secondary | ICD-10-CM | POA: Diagnosis not present

## 2018-12-02 DIAGNOSIS — E1142 Type 2 diabetes mellitus with diabetic polyneuropathy: Secondary | ICD-10-CM

## 2018-12-02 DIAGNOSIS — I10 Essential (primary) hypertension: Secondary | ICD-10-CM

## 2018-12-02 DIAGNOSIS — R972 Elevated prostate specific antigen [PSA]: Secondary | ICD-10-CM

## 2018-12-02 DIAGNOSIS — E1140 Type 2 diabetes mellitus with diabetic neuropathy, unspecified: Secondary | ICD-10-CM | POA: Insufficient documentation

## 2018-12-02 DIAGNOSIS — E669 Obesity, unspecified: Secondary | ICD-10-CM | POA: Insufficient documentation

## 2018-12-02 DIAGNOSIS — E1169 Type 2 diabetes mellitus with other specified complication: Secondary | ICD-10-CM | POA: Insufficient documentation

## 2018-12-02 DIAGNOSIS — R739 Hyperglycemia, unspecified: Secondary | ICD-10-CM

## 2018-12-02 LAB — BASIC METABOLIC PANEL
BUN: 12 mg/dL (ref 6–23)
CO2: 27 mEq/L (ref 19–32)
Calcium: 9.1 mg/dL (ref 8.4–10.5)
Chloride: 104 mEq/L (ref 96–112)
Creatinine, Ser: 0.96 mg/dL (ref 0.40–1.50)
GFR: 77.77 mL/min (ref >60.00)
Glucose, Bld: 114 mg/dL — ABNORMAL HIGH (ref 70–99)
Potassium: 4.1 mEq/L (ref 3.5–5.1)
Sodium: 140 mEq/L (ref 135–145)

## 2018-12-02 LAB — HEMOGLOBIN A1C: Hgb A1c MFr Bld: 5.9 % (ref 4.6–6.5)

## 2018-12-02 MED ORDER — B COMPLEX PO TABS: 1.0000 | ORAL_TABLET | Freq: Every day | ORAL | 3 refills | Status: DC

## 2018-12-02 NOTE — Assessment & Plan Note (Signed)
B feet paresthesia -not better Treat DM Start Vit B complex

## 2018-12-02 NOTE — Assessment & Plan Note (Signed)
Lost wt on diet 

## 2018-12-02 NOTE — Assessment & Plan Note (Signed)
Urol q 6 mo 

## 2018-12-02 NOTE — Assessment & Plan Note (Signed)
Doing well 

## 2018-12-02 NOTE — Patient Instructions (Signed)
  These suggestions will probably help you to lose weight: 1.  Reduce your consumption of sugars and starches.  Eliminate high fructose corn syrup from your diet.  Reduce your consumption of processed foods.  For desserts try to have seasonal fruits, berries with with green, knots, cheeses or dark chocolate with more than 70% cacao. 2.  Do not snack 3.  You do not have to eat breakfast.  If you choose to have breakfast-eat plain greek yogurt, eggs, oatmeal (without sugar) 4.  Drink water, freshly brewed unsweetened tea (green, black or herbal) or coffee.  Do not drink sodas including diet sodas , juices, beverages sweetened with artificial sweeteners. 5.  Reduce your consumption of refined grains. 6.  Avoid protein drinks such as Optifast, Slim fast etc. Eat chicken, fish, meat, dairy and beings for your sources of protein 7.  Natural unprocessed fats like cold pressed virgin olive oil, butter, coconut oil are good for you.  Eat avocados 8.  Increase your consumption of fiber.  Fruits, berries, vegetables, whole grains, flaxseeds, Chia seeds, beans, popcorn, nuts, oatmeal are good sources of fiber 9.  Use vinegar in your diet, i.e. apple cider vinegar, red wine or balsamic vinegar 10.  You can try fasting.  For example you can skip breakfast and lunch every other day (24-hour fast)    Mediterranean diet is good for you.  If you drink alcohol, limit your alcohol intake to no more than 2 drinks a day. The Mediterranean diet is a way of eating based on the traditional cuisine of countries bordering the [place] Sea. While there is no single definition of the Mediterranean diet, it is typically high in vegetables, fruits, whole grains, beans, nut and seeds, and olive oil. The main components of Mediterranean diet include: . Daily consumption of vegetables, fruits, whole grains and healthy fats  . Weekly intake of fish, poultry, beans and eggs  . Moderate portions of dairy products  . Limited  intake of red meat Other important elements of the Mediterranean diet are sharing meals with family and friends, enjoying a glass of red wine and being physically active.  

## 2018-12-02 NOTE — Progress Notes (Signed)
Subjective:  Patient ID: Drew Hansen, male    DOB: 03/07/1951  Age: 68 y.o. MRN: [id number]  CC: No chief complaint on file.   HPI Drew Hansen presents for DM-2, HTN, paresthesia Lost wt on diet  Outpatient Medications Prior to Visit  Medication Sig Dispense Refill  . cholecalciferol (VITAMIN D) 1000 units tablet Take 2 tablets (2,000 Units total) by mouth daily. 100 tablet 5  . clotrimazole-betamethasone (LOTRISONE) cream Apply 1 application topically 2 (two) times daily. 45 g 1  . hydrochlorothiazide (MICROZIDE) 12.5 MG capsule Take 1 capsule (12.5 mg total) by mouth daily. 30 capsule 11  . ibuprofen (ADVIL,MOTRIN) 600 MG tablet Take 600 mg by mouth 2 (two) times daily as needed.      . metFORMIN (GLUCOPHAGE) 500 MG tablet Take 1 tablet (500 mg total) by mouth daily with breakfast. 90 tablet 3   No facility-administered medications prior to visit.     ROS: Review of Systems  Constitutional: Negative for appetite change, fatigue and unexpected weight change.  HENT: Negative for congestion, nosebleeds, sneezing, sore throat and trouble swallowing.   Eyes: Negative for itching and visual disturbance.  Respiratory: Negative for cough.   Cardiovascular: Negative for chest pain, palpitations and leg swelling.  Gastrointestinal: Negative for abdominal distention, blood in stool, diarrhea and nausea.  Genitourinary: Negative for frequency and hematuria.  Musculoskeletal: Negative for back pain, gait problem, joint swelling and neck pain.  Skin: Negative for rash.  Neurological: Negative for dizziness, tremors, speech difficulty and weakness.  Psychiatric/Behavioral: Negative for agitation, dysphoric mood, sleep disturbance and suicidal ideas. The patient is not nervous/anxious.     Objective:  BP 136/76 (BP Location: Left Arm, Patient Position: Sitting, Cuff Size: Large)   Pulse (!) 54   Temp 98 F (36.7 C) (Oral)   Ht 5\' 8"  (1.727 m)   Wt 242 lb (109.8 kg)   SpO2  96%   BMI 36.80 kg/m   BP Readings from Last 3 Encounters:  12/02/18 136/76  10/21/18 140/82  04/26/18 (!) 142/82    Wt Readings from Last 3 Encounters:  12/02/18 242 lb (109.8 kg)  10/21/18 257 lb (116.6 kg)  04/04/18 257 lb (116.6 kg)    Physical Exam Constitutional:      General: He is not in acute distress.    Appearance: He is well-developed.     Comments: NAD  Eyes:     Conjunctiva/sclera: Conjunctivae normal.     Pupils: Pupils are equal, round, and reactive to light.  Neck:     Musculoskeletal: Normal range of motion.     Thyroid: No thyromegaly.     Vascular: No JVD.  Cardiovascular:     Rate and Rhythm: Normal rate and regular rhythm.     Heart sounds: Normal heart sounds. No murmur. No friction rub. No gallop.   Pulmonary:     Effort: Pulmonary effort is normal. No respiratory distress.     Breath sounds: Normal breath sounds. No wheezing or rales.  Chest:     Chest wall: No tenderness.  Abdominal:     General: Bowel sounds are normal. There is no distension.     Palpations: Abdomen is soft. There is no mass.     Tenderness: There is no abdominal tenderness. There is no guarding or rebound.  Musculoskeletal: Normal range of motion.        General: No tenderness.  Lymphadenopathy:     Cervical: No cervical adenopathy.  Skin:    General: Skin is  warm and dry.     Findings: No rash.  Neurological:     Mental Status: He is alert and oriented to person, place, and time.     Cranial Nerves: No cranial nerve deficit.     Motor: No abnormal muscle tone.     Coordination: Coordination normal.     Gait: Gait normal.     Deep Tendon Reflexes: Reflexes are normal and symmetric.  Psychiatric:        Behavior: Behavior normal.        Thought Content: Thought content normal.        Judgment: Judgment normal.     Lab Results  Component Value Date   WBC 5.5 10/21/2018   HGB 15.9 10/21/2018   HCT 44.5 10/21/2018   PLT 192.0 10/21/2018   GLUCOSE 178 (H)  10/21/2018   CHOL 173 06/25/2017   TRIG 135.0 06/25/2017   HDL 46.50 06/25/2017   LDLDIRECT 102.0 06/20/2016   LDLCALC 99 06/25/2017   ALT 44 10/21/2018   AST 30 10/21/2018   NA 139 10/21/2018   K 4.8 10/21/2018   CL 103 10/21/2018   CREATININE 1.01 10/21/2018   BUN 11 10/21/2018   CO2 28 10/21/2018   TSH 1.26 10/21/2018   PSA 10.97 (H) 04/01/2018   HGBA1C 7.3 (H) 10/23/2018    Dg Chest 2 View  Result Date: 06/22/2015 CLINICAL DATA:  Followup pneumonia. No current complaints. Ex smoker. EXAM: CHEST  2 VIEW COMPARISON:  06/24/2014. FINDINGS: No evidence of pneumonia. Mild interstitial prominence is noted bilaterally. There are prominent bronchovascular markings in the lower lobes. These findings are stable. No evidence of pulmonary edema. No pleural effusion or pneumothorax. Cardiac silhouette is normal in size. No mediastinal or hilar masses or evidence of adenopathy. Bony thorax is intact. IMPRESSION: 1. No acute cardiopulmonary disease.  No evidence of pneumonia. Electronically Signed   By: Lajean Manes M.D.   On: 06/22/2015 14:55    Assessment & Plan:   There are no diagnoses linked to this encounter.   No orders of the defined types were placed in this encounter.    Follow-up: No follow-ups on file.  Walker Kehr, MD

## 2018-12-02 NOTE — Assessment & Plan Note (Signed)
Vit D def 

## 2018-12-02 NOTE — Assessment & Plan Note (Signed)
Cont w/Metformin and wt loss RTC 3 mo

## 2019-02-28 ENCOUNTER — Other Ambulatory Visit (INDEPENDENT_AMBULATORY_CARE_PROVIDER_SITE_OTHER): Payer: Medicare Other

## 2019-02-28 DIAGNOSIS — E669 Obesity, unspecified: Secondary | ICD-10-CM

## 2019-02-28 DIAGNOSIS — E1169 Type 2 diabetes mellitus with other specified complication: Secondary | ICD-10-CM

## 2019-02-28 DIAGNOSIS — R972 Elevated prostate specific antigen [PSA]: Secondary | ICD-10-CM

## 2019-02-28 LAB — BASIC METABOLIC PANEL
BUN: 13 mg/dL (ref 6–23)
CO2: 25 mEq/L (ref 19–32)
Calcium: 9.4 mg/dL (ref 8.4–10.5)
Chloride: 104 mEq/L (ref 96–112)
Creatinine, Ser: 0.69 mg/dL (ref 0.40–1.50)
GFR: 113.77 mL/min (ref >60.00)
Glucose, Bld: 144 mg/dL — ABNORMAL HIGH (ref 70–99)
Potassium: 4.1 mEq/L (ref 3.5–5.1)
Sodium: 138 mEq/L (ref 135–145)

## 2019-02-28 LAB — HEMOGLOBIN A1C: Hgb A1c MFr Bld: 7.1 % — ABNORMAL HIGH (ref 4.6–6.5)

## 2019-02-28 LAB — PSA: PSA: 0.41 ng/mL (ref 0.10–4.00)

## 2019-03-03 ENCOUNTER — Encounter: Payer: Self-pay | Admitting: Internal Medicine

## 2019-03-03 ENCOUNTER — Ambulatory Visit (INDEPENDENT_AMBULATORY_CARE_PROVIDER_SITE_OTHER): Payer: Medicare Other | Admitting: Internal Medicine

## 2019-03-03 ENCOUNTER — Other Ambulatory Visit: Payer: Self-pay

## 2019-03-03 VITALS — BP 138/80 | HR 54 | Temp 97.7°F | Ht 68.0 in | Wt 235.0 lb

## 2019-03-03 DIAGNOSIS — E669 Obesity, unspecified: Secondary | ICD-10-CM

## 2019-03-03 DIAGNOSIS — E1169 Type 2 diabetes mellitus with other specified complication: Secondary | ICD-10-CM

## 2019-03-03 DIAGNOSIS — E559 Vitamin D deficiency, unspecified: Secondary | ICD-10-CM | POA: Diagnosis not present

## 2019-03-03 DIAGNOSIS — Z23 Encounter for immunization: Secondary | ICD-10-CM | POA: Diagnosis not present

## 2019-03-03 DIAGNOSIS — E1142 Type 2 diabetes mellitus with diabetic polyneuropathy: Secondary | ICD-10-CM

## 2019-03-03 DIAGNOSIS — R972 Elevated prostate specific antigen [PSA]: Secondary | ICD-10-CM | POA: Diagnosis not present

## 2019-03-03 NOTE — Assessment & Plan Note (Signed)
Vit D 

## 2019-03-03 NOTE — Assessment & Plan Note (Addendum)
Better  On Metformin - pt stopped. Asked to restart Risks associated with treatment noncompliance were discussed. Compliance was encouraged.

## 2019-03-03 NOTE — Progress Notes (Signed)
Subjective:  Patient ID: Drew Hansen, male    DOB: 1950-10-30  Age: 68 y.o. MRN: [id number]  CC: No chief complaint on file.   HPI Drew Hansen presents for DM, HTN, obesity f/u He stopped all meds for ?reason in Sept  Outpatient Medications Prior to Visit  Medication Sig Dispense Refill  . b complex vitamins tablet Take 1 tablet by mouth daily. 100 tablet 3  . cholecalciferol (VITAMIN D) 1000 units tablet Take 2 tablets (2,000 Units total) by mouth daily. 100 tablet 5  . clotrimazole-betamethasone (LOTRISONE) cream Apply 1 application topically 2 (two) times daily. 45 g 1  . hydrochlorothiazide (MICROZIDE) 12.5 MG capsule Take 1 capsule (12.5 mg total) by mouth daily. 30 capsule 11  . ibuprofen (ADVIL,MOTRIN) 600 MG tablet Take 600 mg by mouth 2 (two) times daily as needed.      . metFORMIN (GLUCOPHAGE) 500 MG tablet Take 1 tablet (500 mg total) by mouth daily with breakfast. 90 tablet 3   No facility-administered medications prior to visit.     ROS: Review of Systems  Constitutional: Negative for appetite change, fatigue and unexpected weight change.  HENT: Negative for congestion, nosebleeds, sneezing, sore throat and trouble swallowing.   Eyes: Negative for itching and visual disturbance.  Respiratory: Negative for cough.   Cardiovascular: Negative for chest pain, palpitations and leg swelling.  Gastrointestinal: Negative for abdominal distention, blood in stool, diarrhea and nausea.  Genitourinary: Negative for frequency and hematuria.  Musculoskeletal: Negative for back pain, gait problem, joint swelling and neck pain.  Skin: Negative for rash.  Neurological: Negative for dizziness, tremors, speech difficulty and weakness.  Psychiatric/Behavioral: Negative for agitation, dysphoric mood, sleep disturbance and suicidal ideas. The patient is not nervous/anxious.     Objective:  BP 138/80 (BP Location: Left Arm, Patient Position: Sitting, Cuff Size: Large)    Pulse (!) 54   Temp 97.7 F (36.5 C) (Oral)   Ht 5\' 8"  (1.727 m)   Wt 235 lb (106.6 kg)   SpO2 98%   BMI 35.73 kg/m   BP Readings from Last 3 Encounters:  03/03/19 138/80  12/02/18 136/76  10/21/18 140/82    Wt Readings from Last 3 Encounters:  03/03/19 235 lb (106.6 kg)  12/02/18 242 lb (109.8 kg)  10/21/18 257 lb (116.6 kg)    Physical Exam Constitutional:      General: He is not in acute distress.    Appearance: He is well-developed.     Comments: NAD  Eyes:     Conjunctiva/sclera: Conjunctivae normal.     Pupils: Pupils are equal, round, and reactive to light.  Neck:     Musculoskeletal: Normal range of motion.     Thyroid: No thyromegaly.     Vascular: No JVD.  Cardiovascular:     Rate and Rhythm: Normal rate and regular rhythm.     Heart sounds: Normal heart sounds. No murmur. No friction rub. No gallop.   Pulmonary:     Effort: Pulmonary effort is normal. No respiratory distress.     Breath sounds: Normal breath sounds. No wheezing or rales.  Chest:     Chest wall: No tenderness.  Abdominal:     General: Bowel sounds are normal. There is no distension.     Palpations: Abdomen is soft. There is no mass.     Tenderness: There is no abdominal tenderness. There is no guarding or rebound.  Musculoskeletal: Normal range of motion.        General: No  tenderness.  Lymphadenopathy:     Cervical: No cervical adenopathy.  Skin:    General: Skin is warm and dry.     Findings: No rash.  Neurological:     Mental Status: He is alert and oriented to person, place, and time.     Cranial Nerves: No cranial nerve deficit.     Motor: No abnormal muscle tone.     Coordination: Coordination normal.     Gait: Gait normal.     Deep Tendon Reflexes: Reflexes are normal and symmetric.  Psychiatric:        Behavior: Behavior normal.        Thought Content: Thought content normal.        Judgment: Judgment normal.     Lab Results  Component Value Date   WBC 5.5  10/21/2018   HGB 15.9 10/21/2018   HCT 44.5 10/21/2018   PLT 192.0 10/21/2018   GLUCOSE 144 (H) 02/28/2019   CHOL 173 06/25/2017   TRIG 135.0 06/25/2017   HDL 46.50 06/25/2017   LDLDIRECT 102.0 06/20/2016   LDLCALC 99 06/25/2017   ALT 44 10/21/2018   AST 30 10/21/2018   NA 138 02/28/2019   K 4.1 02/28/2019   CL 104 02/28/2019   CREATININE 0.69 02/28/2019   BUN 13 02/28/2019   CO2 25 02/28/2019   TSH 1.26 10/21/2018   PSA 0.41 02/28/2019   HGBA1C 7.1 (H) 02/28/2019    Dg Chest 2 View  Result Date: 06/22/2015 CLINICAL DATA:  Followup pneumonia. No current complaints. Ex smoker. EXAM: CHEST  2 VIEW COMPARISON:  06/24/2014. FINDINGS: No evidence of pneumonia. Mild interstitial prominence is noted bilaterally. There are prominent bronchovascular markings in the lower lobes. These findings are stable. No evidence of pulmonary edema. No pleural effusion or pneumothorax. Cardiac silhouette is normal in size. No mediastinal or hilar masses or evidence of adenopathy. Bony thorax is intact. IMPRESSION: 1. No acute cardiopulmonary disease.  No evidence of pneumonia. Electronically Signed   By: Lajean Manes M.D.   On: 06/22/2015 14:55    Assessment & Plan:   There are no diagnoses linked to this encounter.   No orders of the defined types were placed in this encounter.    Follow-up: No follow-ups on file.  Walker Kehr, MD

## 2019-03-03 NOTE — Assessment & Plan Note (Signed)
Normal last reading!

## 2019-03-03 NOTE — Assessment & Plan Note (Signed)
Much better w/CBG improvent

## 2019-03-04 NOTE — Addendum Note (Signed)
Addended by: Karren Cobble on: 03/04/2019 11:22 AM   Modules accepted: Orders

## 2019-06-02 ENCOUNTER — Other Ambulatory Visit (INDEPENDENT_AMBULATORY_CARE_PROVIDER_SITE_OTHER): Payer: Medicare PPO

## 2019-06-02 DIAGNOSIS — E1169 Type 2 diabetes mellitus with other specified complication: Secondary | ICD-10-CM | POA: Diagnosis not present

## 2019-06-02 DIAGNOSIS — E669 Obesity, unspecified: Secondary | ICD-10-CM

## 2019-06-02 LAB — BASIC METABOLIC PANEL
BUN: 11 mg/dL (ref 6–23)
CO2: 31 mEq/L (ref 19–32)
Calcium: 9.7 mg/dL (ref 8.4–10.5)
Chloride: 103 mEq/L (ref 96–112)
Creatinine, Ser: 0.96 mg/dL (ref 0.40–1.50)
GFR: 77.66 mL/min (ref >60.00)
Glucose, Bld: 122 mg/dL — ABNORMAL HIGH (ref 70–99)
Potassium: 4.7 mEq/L (ref 3.5–5.1)
Sodium: 140 mEq/L (ref 135–145)

## 2019-06-02 LAB — HEMOGLOBIN A1C: Hgb A1c MFr Bld: 5.2 % (ref 4.6–6.5)

## 2019-06-03 ENCOUNTER — Encounter: Payer: Self-pay | Admitting: Internal Medicine

## 2019-06-03 ENCOUNTER — Ambulatory Visit (INDEPENDENT_AMBULATORY_CARE_PROVIDER_SITE_OTHER): Payer: Medicare PPO | Admitting: Internal Medicine

## 2019-06-03 ENCOUNTER — Other Ambulatory Visit: Payer: Self-pay

## 2019-06-03 DIAGNOSIS — R972 Elevated prostate specific antigen [PSA]: Secondary | ICD-10-CM

## 2019-06-03 DIAGNOSIS — G8929 Other chronic pain: Secondary | ICD-10-CM | POA: Diagnosis not present

## 2019-06-03 DIAGNOSIS — E1169 Type 2 diabetes mellitus with other specified complication: Secondary | ICD-10-CM

## 2019-06-03 DIAGNOSIS — I10 Essential (primary) hypertension: Secondary | ICD-10-CM

## 2019-06-03 DIAGNOSIS — E669 Obesity, unspecified: Secondary | ICD-10-CM

## 2019-06-03 DIAGNOSIS — M5441 Lumbago with sciatica, right side: Secondary | ICD-10-CM | POA: Diagnosis not present

## 2019-06-03 DIAGNOSIS — G629 Polyneuropathy, unspecified: Secondary | ICD-10-CM | POA: Diagnosis not present

## 2019-06-03 MED ORDER — B COMPLEX PO TABS: 1.0000 | ORAL_TABLET | Freq: Every day | ORAL | 3 refills | Status: AC

## 2019-06-03 MED ORDER — METFORMIN HCL 500 MG PO TABS: 500.0000 mg | ORAL_TABLET | Freq: Every day | ORAL | 3 refills | Status: DC

## 2019-06-03 MED ORDER — HYDROCHLOROTHIAZIDE 12.5 MG PO CAPS: 12.5000 mg | ORAL_CAPSULE | Freq: Every day | ORAL | 3 refills | Status: DC

## 2019-06-03 NOTE — Progress Notes (Signed)
Subjective:  Patient ID: Drew Hansen, male    DOB: [birth year]  Age: 69 y.o. MRN: [id number]  CC: No chief complaint on file.   HPI Drew Hansen presents for DM, edema, HCTZ C/o burning or cold feet at night x years  Outpatient Medications Prior to Visit  Medication Sig Dispense Refill  . b complex vitamins tablet Take 1 tablet by mouth daily. 100 tablet 3  . cholecalciferol (VITAMIN D) 1000 units tablet Take 2 tablets (2,000 Units total) by mouth daily. 100 tablet 5  . hydrochlorothiazide (MICROZIDE) 12.5 MG capsule Take 1 capsule (12.5 mg total) by mouth daily. 30 capsule 11  . ibuprofen (ADVIL,MOTRIN) 600 MG tablet Take 600 mg by mouth 2 (two) times daily as needed.      . metFORMIN (GLUCOPHAGE) 500 MG tablet Take 1 tablet (500 mg total) by mouth daily with breakfast. 90 tablet 3   No facility-administered medications prior to visit.    ROS: Review of Systems  Constitutional: Negative for appetite change, fatigue and unexpected weight change.  HENT: Negative for congestion, nosebleeds, sneezing, sore throat and trouble swallowing.   Eyes: Negative for itching and visual disturbance.  Respiratory: Negative for cough.   Cardiovascular: Negative for chest pain, palpitations and leg swelling.  Gastrointestinal: Negative for abdominal distention, blood in stool, diarrhea and nausea.  Genitourinary: Negative for frequency and hematuria.  Musculoskeletal: Negative for back pain, gait problem, joint swelling and neck pain.  Skin: Negative for rash.  Neurological: Negative for dizziness, tremors, speech difficulty and weakness.  Psychiatric/Behavioral: Negative for agitation, dysphoric mood, sleep disturbance and suicidal ideas. The patient is not nervous/anxious.   paresthesia  Objective:  BP 132/76 (BP Location: Left Arm, Patient Position: Sitting, Cuff Size: Large)   Pulse (!) 56   Temp 98 F (36.7 C) (Oral)   Ht 5\' 8"  (1.727 m)   Wt 230 lb (104.3 kg)   SpO2 96%    BMI 34.97 kg/m   BP Readings from Last 3 Encounters:  06/03/19 132/76  03/03/19 138/80  12/02/18 136/76    Wt Readings from Last 3 Encounters:  06/03/19 230 lb (104.3 kg)  03/03/19 235 lb (106.6 kg)  12/02/18 242 lb (109.8 kg)    Physical Exam Constitutional:      General: He is not in acute distress.    Appearance: He is well-developed.     Comments: NAD  Eyes:     Conjunctiva/sclera: Conjunctivae normal.     Pupils: Pupils are equal, round, and reactive to light.  Neck:     Thyroid: No thyromegaly.     Vascular: No JVD.  Cardiovascular:     Rate and Rhythm: Normal rate and regular rhythm.     Heart sounds: Normal heart sounds. No murmur. No friction rub. No gallop.   Pulmonary:     Effort: Pulmonary effort is normal. No respiratory distress.     Breath sounds: Normal breath sounds. No wheezing or rales.  Chest:     Chest wall: No tenderness.  Abdominal:     General: Bowel sounds are normal. There is no distension.     Palpations: Abdomen is soft. There is no mass.     Tenderness: There is no abdominal tenderness. There is no guarding or rebound.  Musculoskeletal:        General: No tenderness. Normal range of motion.     Cervical back: Normal range of motion.  Lymphadenopathy:     Cervical: No cervical adenopathy.  Skin:  General: Skin is warm and dry.     Findings: No rash.  Neurological:     Mental Status: He is alert and oriented to person, place, and time.     Cranial Nerves: No cranial nerve deficit.     Motor: No abnormal muscle tone.     Coordination: Coordination normal.     Gait: Gait normal.     Deep Tendon Reflexes: Reflexes are normal and symmetric.  Psychiatric:        Behavior: Behavior normal.        Thought Content: Thought content normal.        Judgment: Judgment normal.     Lab Results  Component Value Date   WBC 5.5 10/21/2018   HGB 15.9 10/21/2018   HCT 44.5 10/21/2018   PLT 192.0 10/21/2018   GLUCOSE 122 (H) 06/02/2019    CHOL 173 06/25/2017   TRIG 135.0 06/25/2017   HDL 46.50 06/25/2017   LDLDIRECT 102.0 06/20/2016   LDLCALC 99 06/25/2017   ALT 44 10/21/2018   AST 30 10/21/2018   NA 140 06/02/2019   K 4.7 06/02/2019   CL 103 06/02/2019   CREATININE 0.96 06/02/2019   BUN 11 06/02/2019   CO2 31 06/02/2019   TSH 1.26 10/21/2018   PSA 0.41 02/28/2019   HGBA1C 5.2 06/02/2019    DG Chest 2 View  Result Date: 06/22/2015 CLINICAL DATA:  Followup pneumonia. No current complaints. Ex smoker. EXAM: CHEST  2 VIEW COMPARISON:  06/24/2014. FINDINGS: No evidence of pneumonia. Mild interstitial prominence is noted bilaterally. There are prominent bronchovascular markings in the lower lobes. These findings are stable. No evidence of pulmonary edema. No pleural effusion or pneumothorax. Cardiac silhouette is normal in size. No mediastinal or hilar masses or evidence of adenopathy. Bony thorax is intact. IMPRESSION: 1. No acute cardiopulmonary disease.  No evidence of pneumonia. Electronically Signed   By: Lajean Manes M.D.   On: 06/22/2015 14:55    Assessment & Plan:   There are no diagnoses linked to this encounter.   No orders of the defined types were placed in this encounter.    Follow-up: No follow-ups on file.  Walker Kehr, MD

## 2019-06-03 NOTE — Patient Instructions (Signed)

## 2019-06-03 NOTE — Assessment & Plan Note (Signed)
PSA

## 2019-06-03 NOTE — Assessment & Plan Note (Signed)
MSK pains

## 2019-06-03 NOTE — Assessment & Plan Note (Signed)
peripheral - feet

## 2019-06-03 NOTE — Assessment & Plan Note (Signed)
HCTZ 

## 2019-06-03 NOTE — Assessment & Plan Note (Signed)
Better - cont w/wt loss 

## 2019-06-03 NOTE — Assessment & Plan Note (Addendum)
Metformin  A1c A cardiac CT scan for coronary calcium offered

## 2019-07-12 ENCOUNTER — Ambulatory Visit: Payer: Medicare PPO | Attending: Internal Medicine

## 2019-07-12 DIAGNOSIS — Z23 Encounter for immunization: Secondary | ICD-10-CM | POA: Insufficient documentation

## 2019-07-12 NOTE — Progress Notes (Signed)
   Covid-19 Vaccination Clinic  Name:  Drew Hansen    MRN: [id number] DOB: [birth year]  07/12/2019  [name] was observed post Covid-19 immunization for 15 minutes without incidence. He was provided with Vaccine Information Sheet and instruction to access the V-Safe system.   [name] was instructed to call 911 with any severe reactions post vaccine: Marland Kitchen Difficulty breathing  . Swelling of your face and throat  . A fast heartbeat  . A bad rash all over your body  . Dizziness and weakness    Immunizations Administered    Name Date Dose VIS Date Route   Pfizer COVID-19 Vaccine 07/12/2019 12:28 PM 0.3 mL 05/02/2019 Intramuscular   Manufacturer: [employer], [employer]   Lot: [id number]   NDC: [address]-1

## 2019-08-05 ENCOUNTER — Ambulatory Visit: Payer: Medicare PPO | Attending: Internal Medicine

## 2019-08-05 DIAGNOSIS — Z23 Encounter for immunization: Secondary | ICD-10-CM

## 2019-08-05 NOTE — Progress Notes (Signed)
   Covid-19 Vaccination Clinic  Name:  Drew Hansen    MRN: [id number] DOB: Mar 12, 1951  08/05/2019  [name] was observed post Covid-19 immunization for 15 minutes without incident. He was provided with Vaccine Information Sheet and instruction to access the V-Safe system.   [name] was instructed to call 911 with any severe reactions post vaccine: Marland Kitchen Difficulty breathing  . Swelling of face and throat  . A fast heartbeat  . A bad rash all over body  . Dizziness and weakness   Immunizations Administered    Name Date Dose VIS Date Route   Pfizer COVID-19 Vaccine 08/05/2019 11:17 AM 0.3 mL 05/02/2019 Intramuscular   Manufacturer: [employer], [employer]   Lot: [id number]   NDC: [address]-6

## 2019-09-29 ENCOUNTER — Other Ambulatory Visit (INDEPENDENT_AMBULATORY_CARE_PROVIDER_SITE_OTHER): Payer: Medicare PPO

## 2019-09-29 DIAGNOSIS — E669 Obesity, unspecified: Secondary | ICD-10-CM

## 2019-09-29 DIAGNOSIS — E1169 Type 2 diabetes mellitus with other specified complication: Secondary | ICD-10-CM

## 2019-09-29 DIAGNOSIS — R972 Elevated prostate specific antigen [PSA]: Secondary | ICD-10-CM

## 2019-09-29 LAB — BASIC METABOLIC PANEL
BUN: 14 mg/dL (ref 6–23)
CO2: 29 mEq/L (ref 19–32)
Calcium: 9.2 mg/dL (ref 8.4–10.5)
Chloride: 103 mEq/L (ref 96–112)
Creatinine, Ser: 0.91 mg/dL (ref 0.40–1.50)
GFR: 82.52 mL/min (ref >60.00)
Glucose, Bld: 118 mg/dL — ABNORMAL HIGH (ref 70–99)
Potassium: 3.9 mEq/L (ref 3.5–5.1)
Sodium: 139 mEq/L (ref 135–145)

## 2019-09-29 LAB — HEMOGLOBIN A1C: Hgb A1c MFr Bld: 5.3 % (ref 4.6–6.5)

## 2019-09-29 LAB — PSA: PSA: 16.23 ng/mL — ABNORMAL HIGH (ref 0.10–4.00)

## 2019-10-01 ENCOUNTER — Ambulatory Visit (INDEPENDENT_AMBULATORY_CARE_PROVIDER_SITE_OTHER): Payer: Medicare PPO | Admitting: Internal Medicine

## 2019-10-01 ENCOUNTER — Other Ambulatory Visit: Payer: Self-pay

## 2019-10-01 ENCOUNTER — Encounter: Payer: Self-pay | Admitting: Internal Medicine

## 2019-10-01 VITALS — BP 132/70 | HR 53 | Temp 98.0°F | Ht 68.0 in | Wt 233.0 lb

## 2019-10-01 DIAGNOSIS — R972 Elevated prostate specific antigen [PSA]: Secondary | ICD-10-CM | POA: Diagnosis not present

## 2019-10-01 DIAGNOSIS — E669 Obesity, unspecified: Secondary | ICD-10-CM

## 2019-10-01 DIAGNOSIS — E559 Vitamin D deficiency, unspecified: Secondary | ICD-10-CM | POA: Diagnosis not present

## 2019-10-01 DIAGNOSIS — R350 Frequency of micturition: Secondary | ICD-10-CM | POA: Diagnosis not present

## 2019-10-01 DIAGNOSIS — E1169 Type 2 diabetes mellitus with other specified complication: Secondary | ICD-10-CM

## 2019-10-01 DIAGNOSIS — N401 Enlarged prostate with lower urinary tract symptoms: Secondary | ICD-10-CM

## 2019-10-01 DIAGNOSIS — I10 Essential (primary) hypertension: Secondary | ICD-10-CM

## 2019-10-01 NOTE — Assessment & Plan Note (Signed)
Vit D 

## 2019-10-01 NOTE — Assessment & Plan Note (Addendum)
Cont w/HCTZ 

## 2019-10-01 NOTE — Progress Notes (Signed)
Acute Office Visit  Subjective:    Patient ID: Drew Hansen, male    DOB: Nov 10, 1950, 69 y.o.   MRN: [id number]  No chief complaint on file.   HPI Patient is in today for DM, HTN, leg swelling f/u   Past Medical History:  Diagnosis Date  . BPH (benign prostatic hyperplasia) 2007   Elevated PSA, s/p bx Dr Vernie Ammons    Past Surgical History:  Procedure Laterality Date  . PROSTATE BIOPSY     Dr Vernie Ammons - he did not go?    Family History  Problem Relation Age of Onset  . Cancer Mother        Bone  . Diabetes Other     Social History   Socioeconomic History  . Marital status: Married    Spouse name: Not on file  . Number of children: Not on file  . Years of education: Not on file  . Highest education level: Not on file  Occupational History  . Occupation: Professor    Associate Professor: [employer]  Tobacco Use  . Smoking status: Former Games developer  . Smokeless tobacco: Never Used  Substance and Sexual Activity  . Alcohol use: No  . Drug use: No  . Sexual activity: Yes  Other Topics Concern  . Not on file  Social History Narrative   Regular exercise - yes - ping pong and Bikram yoga, walking   Social Determinants of Health   Financial Resource Strain:   . Difficulty of Paying Living Expenses:   Food Insecurity:   . Worried About Programme researcher, broadcasting/film/video in the Last Year:   . Barista in the Last Year:   Transportation Needs:   . Freight forwarder (Medical):   Marland Kitchen Lack of Transportation (Non-Medical):   Physical Activity:   . Days of Exercise per Week:   . Minutes of Exercise per Session:   Stress:   . Feeling of Stress :   Social Connections:   . Frequency of Communication with Friends and Family:   . Frequency of Social Gatherings with Friends and Family:   . Attends Religious Services:   . Active Member of Clubs or Organizations:   . Attends Banker Meetings:   Marland Kitchen Marital Status:   Intimate Partner Violence:   . Fear of  Current or Ex-Partner:   . Emotionally Abused:   Marland Kitchen Physically Abused:   . Sexually Abused:     Outpatient Medications Prior to Visit  Medication Sig Dispense Refill  . b complex vitamins tablet Take 1 tablet by mouth daily. 100 tablet 3  . cholecalciferol (VITAMIN D) 1000 units tablet Take 2 tablets (2,000 Units total) by mouth daily. 100 tablet 5  . hydrochlorothiazide (MICROZIDE) 12.5 MG capsule Take 1 capsule (12.5 mg total) by mouth daily. 90 capsule 3  . ibuprofen (ADVIL,MOTRIN) 600 MG tablet Take 600 mg by mouth 2 (two) times daily as needed.      . metFORMIN (GLUCOPHAGE) 500 MG tablet Take 1 tablet (500 mg total) by mouth daily with breakfast. 90 tablet 3   No facility-administered medications prior to visit.    Allergies  Allergen Reactions  . Ciprofloxacin     Review of Systems  Constitutional: Negative for appetite change, fatigue and unexpected weight change.  HENT: Negative for congestion, nosebleeds, sneezing, sore throat and trouble swallowing.   Eyes: Negative for itching and visual disturbance.  Respiratory: Negative for cough and shortness of breath.   Cardiovascular:  Negative for chest pain, palpitations and leg swelling.  Gastrointestinal: Negative for abdominal distention, blood in stool, diarrhea and nausea.  Genitourinary: Negative for frequency and hematuria.  Musculoskeletal: Negative for back pain, gait problem, joint swelling and neck pain.  Skin: Negative for rash.  Neurological: Negative for dizziness, tremors, speech difficulty and weakness.  Psychiatric/Behavioral: Negative for agitation, dysphoric mood, sleep disturbance and suicidal ideas. The patient is not nervous/anxious.        Objective:    Physical Exam Constitutional:      General: He is not in acute distress.    Appearance: He is well-developed.     Comments: NAD  Eyes:     Conjunctiva/sclera: Conjunctivae normal.     Pupils: Pupils are equal, round, and reactive to light.  Neck:      Thyroid: No thyromegaly.     Vascular: No JVD.  Cardiovascular:     Rate and Rhythm: Normal rate and regular rhythm.     Heart sounds: Normal heart sounds. No murmur. No friction rub. No gallop.   Pulmonary:     Effort: Pulmonary effort is normal. No respiratory distress.     Breath sounds: Normal breath sounds. No wheezing or rales.  Chest:     Chest wall: No tenderness.  Abdominal:     General: Bowel sounds are normal. There is no distension.     Palpations: Abdomen is soft. There is no mass.     Tenderness: There is no abdominal tenderness. There is no guarding or rebound.  Musculoskeletal:        General: No tenderness. Normal range of motion.     Cervical back: Normal range of motion.  Lymphadenopathy:     Cervical: No cervical adenopathy.  Skin:    General: Skin is warm and dry.     Findings: No rash.  Neurological:     Mental Status: He is alert and oriented to person, place, and time.     Cranial Nerves: No cranial nerve deficit.     Motor: No abnormal muscle tone.     Coordination: Coordination normal.     Gait: Gait normal.     Deep Tendon Reflexes: Reflexes are normal and symmetric.  Psychiatric:        Behavior: Behavior normal.        Thought Content: Thought content normal.        Judgment: Judgment normal.     BP 132/70 (BP Location: Left Arm, Patient Position: Sitting, Cuff Size: Large)   Pulse (!) 53   Temp 98 F (36.7 C) (Oral)   Ht 5\' 8"  (1.727 m)   Wt 233 lb (105.7 kg)   SpO2 97%   BMI 35.43 kg/m  Wt Readings from Last 3 Encounters:  10/01/19 233 lb (105.7 kg)  06/03/19 230 lb (104.3 kg)  03/03/19 235 lb (106.6 kg)    Health Maintenance Due  Topic Date Due  . Hepatitis C Screening  Never done  . FOOT EXAM  Never done  . OPHTHALMOLOGY EXAM  Never done  . URINE MICROALBUMIN  Never done  . COLONOSCOPY  Never done    There are no preventive care reminders to display for this patient.   Lab Results  Component Value Date   TSH 1.26  10/21/2018   Lab Results  Component Value Date   WBC 5.5 10/21/2018   HGB 15.9 10/21/2018   HCT 44.5 10/21/2018   MCV 87.9 10/21/2018   PLT 192.0 10/21/2018   Lab Results  Component Value Date   NA 139 09/29/2019   K 3.9 09/29/2019   CO2 29 09/29/2019   GLUCOSE 118 (H) 09/29/2019   BUN 14 09/29/2019   CREATININE 0.91 09/29/2019   BILITOT 0.9 10/21/2018   ALKPHOS 60 10/21/2018   AST 30 10/21/2018   ALT 44 10/21/2018   PROT 7.4 10/21/2018   ALBUMIN 4.4 10/21/2018   CALCIUM 9.2 09/29/2019   GFR 82.52 09/29/2019   Lab Results  Component Value Date   CHOL 173 06/25/2017   Lab Results  Component Value Date   HDL 46.50 06/25/2017   Lab Results  Component Value Date   LDLCALC 99 06/25/2017   Lab Results  Component Value Date   TRIG 135.0 06/25/2017   Lab Results  Component Value Date   CHOLHDL 4 06/25/2017   Lab Results  Component Value Date   HGBA1C 5.3 09/29/2019       Assessment & Plan:   Problem List Items Addressed This Visit    None        Walker Kehr, MD

## 2019-10-01 NOTE — Assessment & Plan Note (Signed)
On Metformin 

## 2019-10-01 NOTE — Assessment & Plan Note (Addendum)
PSA is up. Re-check in 3 months Urol f/u

## 2020-03-03 ENCOUNTER — Other Ambulatory Visit: Payer: Self-pay | Admitting: Internal Medicine

## 2020-04-05 ENCOUNTER — Other Ambulatory Visit (INDEPENDENT_AMBULATORY_CARE_PROVIDER_SITE_OTHER): Payer: Medicare PPO

## 2020-04-05 DIAGNOSIS — R350 Frequency of micturition: Secondary | ICD-10-CM

## 2020-04-05 DIAGNOSIS — E1169 Type 2 diabetes mellitus with other specified complication: Secondary | ICD-10-CM

## 2020-04-05 DIAGNOSIS — I10 Essential (primary) hypertension: Secondary | ICD-10-CM

## 2020-04-05 DIAGNOSIS — E119 Type 2 diabetes mellitus without complications: Secondary | ICD-10-CM

## 2020-04-05 DIAGNOSIS — E669 Obesity, unspecified: Secondary | ICD-10-CM | POA: Diagnosis not present

## 2020-04-05 DIAGNOSIS — R972 Elevated prostate specific antigen [PSA]: Secondary | ICD-10-CM

## 2020-04-05 DIAGNOSIS — N401 Enlarged prostate with lower urinary tract symptoms: Secondary | ICD-10-CM | POA: Diagnosis not present

## 2020-04-05 LAB — BASIC METABOLIC PANEL
BUN: 14 mg/dL (ref 6–23)
CO2: 30 mEq/L (ref 19–32)
Calcium: 9.1 mg/dL (ref 8.4–10.5)
Chloride: 103 mEq/L (ref 96–112)
Creatinine, Ser: 0.86 mg/dL (ref 0.40–1.50)
GFR: 88.13 mL/min (ref >60.00)
Glucose, Bld: 137 mg/dL — ABNORMAL HIGH (ref 70–99)
Potassium: 4.1 mEq/L (ref 3.5–5.1)
Sodium: 140 mEq/L (ref 135–145)

## 2020-04-05 LAB — PSA: PSA: 16.89 ng/mL — ABNORMAL HIGH (ref 0.10–4.00)

## 2020-04-05 LAB — HEMOGLOBIN A1C: Hgb A1c MFr Bld: 6.2 % (ref 4.6–6.5)

## 2020-04-06 ENCOUNTER — Telehealth: Payer: Self-pay | Admitting: Internal Medicine

## 2020-04-06 NOTE — Telephone Encounter (Signed)
LVM for pt to rtn my call to schedule AWV with [facility].  

## 2020-04-07 ENCOUNTER — Encounter: Payer: Self-pay | Admitting: Internal Medicine

## 2020-04-07 ENCOUNTER — Other Ambulatory Visit: Payer: Self-pay

## 2020-04-07 ENCOUNTER — Ambulatory Visit (INDEPENDENT_AMBULATORY_CARE_PROVIDER_SITE_OTHER): Payer: Medicare PPO | Admitting: Internal Medicine

## 2020-04-07 DIAGNOSIS — R059 Cough, unspecified: Secondary | ICD-10-CM | POA: Insufficient documentation

## 2020-04-07 DIAGNOSIS — R972 Elevated prostate specific antigen [PSA]: Secondary | ICD-10-CM | POA: Diagnosis not present

## 2020-04-07 DIAGNOSIS — E669 Obesity, unspecified: Secondary | ICD-10-CM

## 2020-04-07 DIAGNOSIS — E1169 Type 2 diabetes mellitus with other specified complication: Secondary | ICD-10-CM

## 2020-04-07 DIAGNOSIS — I10 Essential (primary) hypertension: Secondary | ICD-10-CM | POA: Diagnosis not present

## 2020-04-07 DIAGNOSIS — E559 Vitamin D deficiency, unspecified: Secondary | ICD-10-CM | POA: Diagnosis not present

## 2020-04-07 NOTE — Assessment & Plan Note (Addendum)
On Metformin Cardiac CT

## 2020-04-07 NOTE — Patient Instructions (Signed)

## 2020-04-07 NOTE — Assessment & Plan Note (Signed)
CT chest

## 2020-04-07 NOTE — Assessment & Plan Note (Signed)
Vit d 

## 2020-04-07 NOTE — Progress Notes (Signed)
Subjective:  Patient ID: Drew Hansen, male    DOB: 1951/01/12  Age: 69 y.o. MRN: [id number]  CC: Follow-up (On labs)   HPI Jakob Kimberlin presents for cough, DM, HTN  Outpatient Medications Prior to Visit  Medication Sig Dispense Refill  . b complex vitamins tablet Take 1 tablet by mouth daily. 100 tablet 3  . cholecalciferol (VITAMIN D) 1000 units tablet Take 2 tablets (2,000 Units total) by mouth daily. 100 tablet 5  . hydrochlorothiazide (MICROZIDE) 12.5 MG capsule Take 1 capsule (12.5 mg total) by mouth daily. 90 capsule 3  . ibuprofen (ADVIL,MOTRIN) 600 MG tablet Take 600 mg by mouth 2 (two) times daily as needed.      . metFORMIN (GLUCOPHAGE) 500 MG tablet TAKE 1 TABLET(500 MG) BY MOUTH DAILY WITH BREAKFAST 90 tablet 3   No facility-administered medications prior to visit.    ROS: Review of Systems  Constitutional: Positive for unexpected weight change. Negative for appetite change and fatigue.  HENT: Negative for congestion, nosebleeds, sneezing, sore throat and trouble swallowing.   Eyes: Negative for itching and visual disturbance.  Respiratory: Negative for cough.   Cardiovascular: Negative for chest pain, palpitations and leg swelling.  Gastrointestinal: Negative for abdominal distention, blood in stool, diarrhea and nausea.  Genitourinary: Negative for frequency and hematuria.  Musculoskeletal: Negative for back pain, gait problem, joint swelling and neck pain.  Skin: Negative for rash.  Neurological: Negative for dizziness, tremors, speech difficulty and weakness.  Psychiatric/Behavioral: Negative for agitation, dysphoric mood and sleep disturbance. The patient is not nervous/anxious.     Objective:  BP 120/80 (BP Location: Left Arm)   Pulse 66   Temp 97.8 F (36.6 C) (Oral)   Wt 238 lb (108 kg)   SpO2 95%   BMI 36.19 kg/m   BP Readings from Last 3 Encounters:  04/07/20 120/80  10/01/19 132/70  06/03/19 132/76    Wt Readings from Last 3  Encounters:  04/07/20 238 lb (108 kg)  10/01/19 233 lb (105.7 kg)  06/03/19 230 lb (104.3 kg)    Physical Exam Constitutional:      General: He is not in acute distress.    Appearance: He is well-developed.     Comments: NAD  Eyes:     Conjunctiva/sclera: Conjunctivae normal.     Pupils: Pupils are equal, round, and reactive to light.  Neck:     Thyroid: No thyromegaly.     Vascular: No JVD.  Cardiovascular:     Rate and Rhythm: Normal rate and regular rhythm.     Heart sounds: Normal heart sounds. No murmur heard.  No friction rub. No gallop.   Pulmonary:     Effort: Pulmonary effort is normal. No respiratory distress.     Breath sounds: Normal breath sounds. No wheezing or rales.  Chest:     Chest wall: No tenderness.  Abdominal:     General: Bowel sounds are normal. There is no distension.     Palpations: Abdomen is soft. There is no mass.     Tenderness: There is no abdominal tenderness. There is no guarding or rebound.  Musculoskeletal:        General: No tenderness. Normal range of motion.     Cervical back: Normal range of motion.  Lymphadenopathy:     Cervical: No cervical adenopathy.  Skin:    General: Skin is warm and dry.     Findings: No rash.  Neurological:     Mental Status: He is alert and  oriented to person, place, and time.     Cranial Nerves: No cranial nerve deficit.     Motor: No abnormal muscle tone.     Coordination: Coordination normal.     Gait: Gait normal.     Deep Tendon Reflexes: Reflexes are normal and symmetric.  Psychiatric:        Behavior: Behavior normal.        Thought Content: Thought content normal.        Judgment: Judgment normal.     Lab Results  Component Value Date   WBC 5.5 10/21/2018   HGB 15.9 10/21/2018   HCT 44.5 10/21/2018   PLT 192.0 10/21/2018   GLUCOSE 137 (H) 04/05/2020   CHOL 173 06/25/2017   TRIG 135.0 06/25/2017   HDL 46.50 06/25/2017   LDLDIRECT 102.0 06/20/2016   LDLCALC 99 06/25/2017   ALT 44  10/21/2018   AST 30 10/21/2018   NA 140 04/05/2020   K 4.1 04/05/2020   CL 103 04/05/2020   CREATININE 0.86 04/05/2020   BUN 14 04/05/2020   CO2 30 04/05/2020   TSH 1.26 10/21/2018   PSA 16.89 (H) 04/05/2020   HGBA1C 6.2 04/05/2020    DG Chest 2 View  Result Date: 06/22/2015 CLINICAL DATA:  Followup pneumonia. No current complaints. Ex smoker. EXAM: CHEST  2 VIEW COMPARISON:  06/24/2014. FINDINGS: No evidence of pneumonia. Mild interstitial prominence is noted bilaterally. There are prominent bronchovascular markings in the lower lobes. These findings are stable. No evidence of pulmonary edema. No pleural effusion or pneumothorax. Cardiac silhouette is normal in size. No mediastinal or hilar masses or evidence of adenopathy. Bony thorax is intact. IMPRESSION: 1. No acute cardiopulmonary disease.  No evidence of pneumonia. Electronically Signed   By: Amie Portland M.D.   On: 06/22/2015 14:55    Assessment & Plan:    Sonda Primes, MD

## 2020-04-07 NOTE — Assessment & Plan Note (Signed)
BP Readings from Last 3 Encounters:  04/07/20 120/80  10/01/19 132/70  06/03/19 132/76

## 2020-04-07 NOTE — Assessment & Plan Note (Signed)
F/u w/Urology - pt declined PSA next time

## 2020-04-21 ENCOUNTER — Encounter: Payer: Self-pay | Admitting: Internal Medicine

## 2020-06-13 ENCOUNTER — Other Ambulatory Visit: Payer: Self-pay | Admitting: Internal Medicine

## 2020-08-07 ENCOUNTER — Other Ambulatory Visit: Payer: Self-pay | Admitting: Internal Medicine

## 2020-08-07 MED ORDER — HYDROCHLOROTHIAZIDE 12.5 MG PO CAPS
ORAL_CAPSULE | ORAL | 3 refills | Status: DC
Start: 2020-08-07 — End: 2021-09-13

## 2020-08-07 MED ORDER — METFORMIN HCL 500 MG PO TABS
ORAL_TABLET | ORAL | 3 refills | Status: DC
Start: 2020-08-07 — End: 2021-09-09

## 2020-12-17 ENCOUNTER — Inpatient Hospital Stay: Admission: RE | Admit: 2020-12-17 | Payer: Medicare PPO | Source: Ambulatory Visit

## 2020-12-20 ENCOUNTER — Ambulatory Visit (INDEPENDENT_AMBULATORY_CARE_PROVIDER_SITE_OTHER)
Admission: RE | Admit: 2020-12-20 | Discharge: 2020-12-20 | Disposition: A | Discharge status: Home / Self Care | Payer: Self-pay | Source: Ambulatory Visit | Attending: Internal Medicine | Admitting: Internal Medicine

## 2020-12-20 ENCOUNTER — Other Ambulatory Visit: Payer: Self-pay

## 2020-12-20 DIAGNOSIS — E1169 Type 2 diabetes mellitus with other specified complication: Secondary | ICD-10-CM

## 2020-12-20 DIAGNOSIS — E669 Obesity, unspecified: Secondary | ICD-10-CM

## 2020-12-22 ENCOUNTER — Encounter: Payer: Self-pay | Admitting: Internal Medicine

## 2020-12-22 ENCOUNTER — Other Ambulatory Visit: Payer: Self-pay | Admitting: Internal Medicine

## 2020-12-22 DIAGNOSIS — I2510 Atherosclerotic heart disease of native coronary artery without angina pectoris: Secondary | ICD-10-CM | POA: Insufficient documentation

## 2020-12-22 MED ORDER — ASPIRIN 81 MG PO TBEC: 81.0000 mg | DELAYED_RELEASE_TABLET | Freq: Every day | ORAL | 12 refills | Status: AC

## 2020-12-26 ENCOUNTER — Other Ambulatory Visit: Payer: Self-pay | Admitting: Internal Medicine

## 2020-12-26 MED ORDER — ROSUVASTATIN CALCIUM 5 MG PO TABS: 5.0000 mg | ORAL_TABLET | Freq: Every day | ORAL | 3 refills | Status: DC

## 2020-12-28 ENCOUNTER — Telehealth: Payer: Self-pay | Admitting: *Deleted

## 2020-12-28 NOTE — Telephone Encounter (Signed)
-----   Message from Tearra D Buckson sent at 12/27/2020  4:41 PM EDT ----- For review  

## 2020-12-28 NOTE — Telephone Encounter (Signed)
Insurance will not cover Rosuvastatin. Plan alternative simvastatin, lovastatin, and pravastatin.Marland KitchenRaechel Hansen

## 2020-12-28 NOTE — Telephone Encounter (Signed)
-----   Message from Don Broach sent at 12/27/2020  4:41 PM EDT ----- For review

## 2020-12-29 MED ORDER — ROSUVASTATIN CALCIUM 5 MG PO TABS: 5.0000 mg | ORAL_TABLET | Freq: Every day | ORAL | 3 refills | Status: DC

## 2020-12-29 MED ORDER — PRAVASTATIN SODIUM 20 MG PO TABS: 20.0000 mg | ORAL_TABLET | Freq: Every day | ORAL | 3 refills | Status: DC

## 2020-12-29 NOTE — Telephone Encounter (Signed)
See mychart msg. Pt is going to stay on the Crestor (Rosuvstatin) he went ahead and purchased. Cancel pravastatin rx and added back Rosuvastatin 5 mg.Marland KitchenRaechel Chute

## 2020-12-29 NOTE — Telephone Encounter (Signed)
Okay pravastatin.  Thanks

## 2021-04-21 ENCOUNTER — Other Ambulatory Visit (INDEPENDENT_AMBULATORY_CARE_PROVIDER_SITE_OTHER): Payer: Medicare PPO

## 2021-04-21 DIAGNOSIS — E1169 Type 2 diabetes mellitus with other specified complication: Secondary | ICD-10-CM

## 2021-04-21 DIAGNOSIS — E669 Obesity, unspecified: Secondary | ICD-10-CM

## 2021-04-21 LAB — BASIC METABOLIC PANEL
BUN: 16 mg/dL (ref 6–23)
CO2: 28 mEq/L (ref 19–32)
Calcium: 9.6 mg/dL (ref 8.4–10.5)
Chloride: 104 mEq/L (ref 96–112)
Creatinine, Ser: 1.11 mg/dL (ref 0.40–1.50)
GFR: 67.1 mL/min (ref >60.00)
Glucose, Bld: 167 mg/dL — ABNORMAL HIGH (ref 70–99)
Potassium: 3.9 mEq/L (ref 3.5–5.1)
Sodium: 139 mEq/L (ref 135–145)

## 2021-04-21 LAB — HEMOGLOBIN A1C: Hgb A1c MFr Bld: 6.5 % (ref 4.6–6.5)

## 2021-04-25 ENCOUNTER — Encounter: Payer: Self-pay | Admitting: Internal Medicine

## 2021-04-25 ENCOUNTER — Ambulatory Visit (INDEPENDENT_AMBULATORY_CARE_PROVIDER_SITE_OTHER): Payer: Medicare PPO | Admitting: Internal Medicine

## 2021-04-25 ENCOUNTER — Other Ambulatory Visit: Payer: Self-pay

## 2021-04-25 DIAGNOSIS — H9193 Unspecified hearing loss, bilateral: Secondary | ICD-10-CM | POA: Diagnosis not present

## 2021-04-25 DIAGNOSIS — R600 Localized edema: Secondary | ICD-10-CM | POA: Diagnosis not present

## 2021-04-25 DIAGNOSIS — E669 Obesity, unspecified: Secondary | ICD-10-CM | POA: Diagnosis not present

## 2021-04-25 DIAGNOSIS — E1169 Type 2 diabetes mellitus with other specified complication: Secondary | ICD-10-CM

## 2021-04-25 DIAGNOSIS — R609 Edema, unspecified: Secondary | ICD-10-CM | POA: Insufficient documentation

## 2021-04-25 DIAGNOSIS — H9190 Unspecified hearing loss, unspecified ear: Secondary | ICD-10-CM | POA: Insufficient documentation

## 2021-04-25 MED ORDER — FLUTICASONE PROPIONATE 50 MCG/ACT NA SUSP: 2.0000 | Freq: Every day | NASAL | 6 refills | Status: DC

## 2021-04-25 MED ORDER — LORATADINE 10 MG PO TABS: 10.0000 mg | ORAL_TABLET | Freq: Every day | ORAL | 11 refills | Status: AC

## 2021-04-25 MED ORDER — FUROSEMIDE 20 MG PO TABS: 20.0000 mg | ORAL_TABLET | Freq: Every day | ORAL | 2 refills | Status: DC | PRN

## 2021-04-25 NOTE — Assessment & Plan Note (Signed)
B ears - treat allergies ENT ref

## 2021-04-25 NOTE — Patient Instructions (Addendum)
Ozempic, Mounjaro Rybelsus  HOKA Bondi 7 EE shoes  Diabetic socks  Nasal rinse squeeze bottle for nasal irrigation  ;;;;;;;;;;;;;;;;;;;;;;  The Obesity Code book by Wylene Simmer   These suggestions will probably help you to improve your metabolism if you are not overweight and to lose weight if you are overweight: 1.  Reduce your consumption of sugars and starches.  Eliminate high fructose corn syrup from your diet.  Reduce your consumption of processed foods.  For desserts try to have seasonal fruits, berries, nuts, cheeses or dark chocolate with more than 70% cacao. 2.  Do not snack 3.  You do not have to eat breakfast.  If you choose to have breakfast - eat plain greek yogurt, eggs, oatmeal (without sugar) - use honey if you need to. 4.  Drink water, freshly brewed unsweetened tea (green, black or herbal) or coffee.  Do not drink sodas including diet sodas , juices, beverages sweetened with artificial sweeteners. 5.  Reduce your consumption of refined grains. 6.  Avoid protein drinks such as Optifast, Slim fast etc. Eat chicken, fish, meat, dairy and beans for your sources of protein. 7.  Natural unprocessed fats like cold pressed virgin olive oil, butter, coconut oil are good for you.  Eat avocados. 8.  Increase your consumption of fiber.  Fruits, berries, vegetables, whole grains, flaxseed, chia seeds, beans, popcorn, nuts, oatmeal are good sources of fiber 9.  Use vinegar in your diet, i.e. apple cider vinegar, red wine or balsamic vinegar 10.  You can try fasting.  For example you can skip breakfast and lunch every other day (24-hour fast) 11.  Stress reduction, good night sleep, relaxation, meditation, yoga and other physical activity is likely to help you to maintain low weight too. 12.  If you drink alcohol, limit your alcohol intake to no more than 2 drinks a day.

## 2021-04-25 NOTE — Progress Notes (Signed)
Subjective:  Patient ID: Drew Hansen, male    DOB: 10/14/1950  Age: 70 y.o. MRN: [id number]  CC: Annual Exam   HPI Drew Hansen presents for DM, wt gain C/o hearing loss  Outpatient Medications Prior to Visit  Medication Sig Dispense Refill   aspirin 81 MG EC tablet Take 1 tablet (81 mg total) by mouth daily. Swallow whole. 30 tablet 12   b complex vitamins tablet Take 1 tablet by mouth daily. 100 tablet 3   cholecalciferol (VITAMIN D) 1000 units tablet Take 2 tablets (2,000 Units total) by mouth daily. 100 tablet 5   hydrochlorothiazide (MICROZIDE) 12.5 MG capsule TAKE 1 CAPSULE(12.5 MG) BY MOUTH DAILY 90 capsule 3   ibuprofen (ADVIL,MOTRIN) 600 MG tablet Take 600 mg by mouth 2 (two) times daily as needed.       metFORMIN (GLUCOPHAGE) 500 MG tablet TAKE 1 TABLET(500 MG) BY MOUTH DAILY WITH BREAKFAST 90 tablet 3   rosuvastatin (CRESTOR) 5 MG tablet Take 1 tablet (5 mg total) by mouth daily. 90 tablet 3   No facility-administered medications prior to visit.    ROS: Review of Systems  Constitutional:  Positive for unexpected weight change. Negative for appetite change and fatigue.  HENT:  Negative for congestion, nosebleeds, sneezing, sore throat and trouble swallowing.   Eyes:  Negative for itching and visual disturbance.  Respiratory:  Negative for cough.   Cardiovascular:  Negative for chest pain, palpitations and leg swelling.  Gastrointestinal:  Negative for abdominal distention, blood in stool, diarrhea and nausea.  Genitourinary:  Negative for frequency and hematuria.  Musculoskeletal:  Negative for back pain, gait problem, joint swelling and neck pain.  Skin:  Negative for rash.  Neurological:  Negative for dizziness, tremors, speech difficulty and weakness.  Psychiatric/Behavioral:  Negative for agitation, dysphoric mood and sleep disturbance. The patient is not nervous/anxious.    Objective:  BP 120/62 (BP Location: Left Arm)   Pulse 61   Temp 98 F (36.7  C) (Oral)   Ht 5\' 8"  (1.727 m)   Wt 259 lb 3.2 oz (117.6 kg)   SpO2 95%   BMI 39.41 kg/m   BP Readings from Last 3 Encounters:  04/25/21 120/62  04/07/20 120/80  10/01/19 132/70    Wt Readings from Last 3 Encounters:  04/25/21 259 lb 3.2 oz (117.6 kg)  04/07/20 238 lb (108 kg)  10/01/19 233 lb (105.7 kg)    Physical Exam Constitutional:      General: He is not in acute distress.    Appearance: He is well-developed.     Comments: NAD  Eyes:     Conjunctiva/sclera: Conjunctivae normal.     Pupils: Pupils are equal, round, and reactive to light.  Neck:     Thyroid: No thyromegaly.     Vascular: No JVD.  Cardiovascular:     Rate and Rhythm: Normal rate and regular rhythm.     Heart sounds: Normal heart sounds. No murmur heard.   No friction rub. No gallop.  Pulmonary:     Effort: Pulmonary effort is normal. No respiratory distress.     Breath sounds: Normal breath sounds. No wheezing or rales.  Chest:     Chest wall: No tenderness.  Abdominal:     General: Bowel sounds are normal. There is no distension.     Palpations: Abdomen is soft. There is no mass.     Tenderness: There is no abdominal tenderness. There is no guarding or rebound.  Musculoskeletal:  General: No tenderness. Normal range of motion.     Cervical back: Normal range of motion.  Lymphadenopathy:     Cervical: No cervical adenopathy.  Skin:    General: Skin is warm and dry.     Findings: No rash.  Neurological:     Mental Status: He is alert and oriented to person, place, and time.     Cranial Nerves: No cranial nerve deficit.     Motor: No abnormal muscle tone.     Coordination: Coordination normal.     Gait: Gait normal.     Deep Tendon Reflexes: Reflexes are normal and symmetric.  Psychiatric:        Behavior: Behavior normal.        Thought Content: Thought content normal.        Judgment: Judgment normal.   1+ edema B Lab Results  Component Value Date   WBC 5.5 10/21/2018   HGB  15.9 10/21/2018   HCT 44.5 10/21/2018   PLT 192.0 10/21/2018   GLUCOSE 167 (H) 04/21/2021   CHOL 173 06/25/2017   TRIG 135.0 06/25/2017   HDL 46.50 06/25/2017   LDLDIRECT 102.0 06/20/2016   LDLCALC 99 06/25/2017   ALT 44 10/21/2018   AST 30 10/21/2018   NA 139 04/21/2021   K 3.9 04/21/2021   CL 104 04/21/2021   CREATININE 1.11 04/21/2021   BUN 16 04/21/2021   CO2 28 04/21/2021   TSH 1.26 10/21/2018   PSA 16.89 (H) 04/05/2020   HGBA1C 6.5 04/21/2021    CT CARDIAC SCORING  Addendum Date: 12/23/2020   ADDENDUM REPORT: 12/23/2020 16:48 EXAM: OVER-READ INTERPRETATION  CT CHEST The following report is an over-read performed by radiologist Dr. Barnetta Hammersmith [facility] on 12/23/2020. This over-read does not include interpretation of cardiac or coronary anatomy or pathology. The coronary calcium score interpretation by the cardiologist is attached. COMPARISON:  None. FINDINGS: No significant noncardiac vascular findings. No visualized lymphadenopathy. Region of mild bronchiectasis with associated ground-glass opacity in the medial aspect of the right lower lobe is likely post inflammatory in origin. Visualized lungs show no evidence of pulmonary edema, consolidation, pneumothorax, nodule or pleural fluid. Visualized upper abdomen and bony structures are unremarkable. IMPRESSION: No significant incidental findings. Focal area of ground-glass opacity with associated mild regional bronchiectasis in the medial right lower lobe is likely post inflammatory in origin. Electronically Signed   By: Aletta Edouard M.D.   On: 12/23/2020 16:48   Result Date: 12/23/2020 CLINICAL DATA:  Risk stratification EXAM: Coronary Calcium Score TECHNIQUE: The patient was scanned on a Siemens Somatom 64 slice scanner. Axial non-contrast 62mm slices were carried out through the heart. The data set was analyzed on a dedicated work station and scored using the [place]. FINDINGS: Non-cardiac: No significant  non cardiac findings on limited lung and soft tissue windows. See separate report from [facility]. Ascending Aorta: Normal diameter 3.7 cm Pericardium: Normal Coronary arteries: 3 vessel calcium noted LAD 177 LCX 62.6 RCA 180 Total:  420 IMPRESSION: Coronary calcium score of 420. This was 39 th percentile for age and sex matched control. Consider f/u perfusion study if clinically indicated Jenkins Rouge Electronically Signed: By: Jenkins Rouge M.D. On: 12/20/2020 14:34    Assessment & Plan:   Problem List Items Addressed This Visit     Diabetes mellitus type 2 in obese ([place])    On Metformin - pt stopped. Asked to restart Risks associated with treatment noncompliance were discussed. Compliance was encouraged. Read  about Ozempic, Mounjaro, Rybelsus      Edema    Wt loss Lasix prn      Hearing loss    B ears - treat allergies ENT ref      Relevant Orders   Ambulatory referral to ENT      Meds ordered this encounter  Medications   loratadine (CLARITIN) 10 MG tablet    Sig: Take 1 tablet (10 mg total) by mouth daily.    Dispense:  30 tablet    Refill:  11   fluticasone (FLONASE) 50 MCG/ACT nasal spray    Sig: Place 2 sprays into both nostrils daily.    Dispense:  16 g    Refill:  6   furosemide (LASIX) 20 MG tablet    Sig: Take 1-2 tablets (20-40 mg total) by mouth daily as needed for edema.    Dispense:  60 tablet    Refill:  2      Follow-up: Return in about 3 months (around 07/24/2021) for a follow-up visit.  Walker Kehr, MD

## 2021-04-25 NOTE — Assessment & Plan Note (Signed)
Wt loss Lasix prn

## 2021-04-25 NOTE — Assessment & Plan Note (Signed)
On Metformin - pt stopped. Asked to restart Risks associated with treatment noncompliance were discussed. Compliance was encouraged. Read about Ozempic, Mounjaro, Rybelsus

## 2021-05-13 ENCOUNTER — Encounter: Payer: Self-pay | Admitting: Internal Medicine

## 2021-05-16 ENCOUNTER — Other Ambulatory Visit: Payer: Self-pay | Admitting: Internal Medicine

## 2021-05-16 DIAGNOSIS — R972 Elevated prostate specific antigen [PSA]: Secondary | ICD-10-CM

## 2021-05-31 ENCOUNTER — Other Ambulatory Visit: Payer: Self-pay | Admitting: Internal Medicine

## 2021-05-31 DIAGNOSIS — R972 Elevated prostate specific antigen [PSA]: Secondary | ICD-10-CM

## 2021-06-06 ENCOUNTER — Other Ambulatory Visit: Payer: Medicare PPO

## 2021-06-06 DIAGNOSIS — R972 Elevated prostate specific antigen [PSA]: Secondary | ICD-10-CM | POA: Diagnosis not present

## 2021-06-07 LAB — PSA, TOTAL AND FREE
PSA, Free: 2.7 ng/mL
PSA, Total: 14.2 ng/mL — ABNORMAL HIGH (ref <=4.0)

## 2021-06-09 ENCOUNTER — Encounter: Payer: Self-pay | Admitting: Internal Medicine

## 2021-06-16 ENCOUNTER — Encounter: Payer: Self-pay | Admitting: Internal Medicine

## 2021-06-16 DIAGNOSIS — R351 Nocturia: Secondary | ICD-10-CM | POA: Diagnosis not present

## 2021-06-16 DIAGNOSIS — R3912 Poor urinary stream: Secondary | ICD-10-CM | POA: Diagnosis not present

## 2021-06-16 DIAGNOSIS — N4232 Atypical small acinar proliferation of prostate: Secondary | ICD-10-CM | POA: Diagnosis not present

## 2021-06-16 DIAGNOSIS — R972 Elevated prostate specific antigen [PSA]: Secondary | ICD-10-CM | POA: Diagnosis not present

## 2021-06-16 DIAGNOSIS — N401 Enlarged prostate with lower urinary tract symptoms: Secondary | ICD-10-CM | POA: Diagnosis not present

## 2021-06-20 ENCOUNTER — Other Ambulatory Visit: Payer: Self-pay | Admitting: Internal Medicine

## 2021-06-20 MED ORDER — OZEMPIC (0.25 OR 0.5 MG/DOSE) 2 MG/1.5ML ~~LOC~~ SOPN: 0.2500 mg | PEN_INJECTOR | SUBCUTANEOUS | 2 refills | Status: DC

## 2021-06-20 NOTE — Progress Notes (Signed)
Ozempic

## 2021-07-12 ENCOUNTER — Other Ambulatory Visit: Payer: Self-pay | Admitting: Urology

## 2021-07-12 DIAGNOSIS — N4232 Atypical small acinar proliferation of prostate: Secondary | ICD-10-CM

## 2021-07-12 DIAGNOSIS — R972 Elevated prostate specific antigen [PSA]: Secondary | ICD-10-CM

## 2021-07-21 DIAGNOSIS — H9113 Presbycusis, bilateral: Secondary | ICD-10-CM | POA: Diagnosis not present

## 2021-07-21 DIAGNOSIS — H9313 Tinnitus, bilateral: Secondary | ICD-10-CM | POA: Insufficient documentation

## 2021-07-21 DIAGNOSIS — H903 Sensorineural hearing loss, bilateral: Secondary | ICD-10-CM | POA: Diagnosis not present

## 2021-07-22 ENCOUNTER — Encounter: Payer: Self-pay | Admitting: Internal Medicine

## 2021-07-27 ENCOUNTER — Other Ambulatory Visit: Payer: Self-pay | Admitting: Internal Medicine

## 2021-07-27 ENCOUNTER — Other Ambulatory Visit: Payer: Self-pay

## 2021-07-27 ENCOUNTER — Ambulatory Visit
Admission: RE | Admit: 2021-07-27 | Discharge: 2021-07-27 | Disposition: A | Discharge status: Home / Self Care | Payer: Medicare PPO | Source: Ambulatory Visit | Attending: Urology | Admitting: Urology

## 2021-07-27 DIAGNOSIS — R972 Elevated prostate specific antigen [PSA]: Secondary | ICD-10-CM | POA: Diagnosis not present

## 2021-07-27 DIAGNOSIS — N3289 Other specified disorders of bladder: Secondary | ICD-10-CM | POA: Diagnosis not present

## 2021-07-27 DIAGNOSIS — N323 Diverticulum of bladder: Secondary | ICD-10-CM | POA: Diagnosis not present

## 2021-07-27 DIAGNOSIS — N4232 Atypical small acinar proliferation of prostate: Secondary | ICD-10-CM

## 2021-07-27 DIAGNOSIS — N402 Nodular prostate without lower urinary tract symptoms: Secondary | ICD-10-CM | POA: Diagnosis not present

## 2021-07-27 MED ORDER — GADOBENATE DIMEGLUMINE 529 MG/ML IV SOLN
20.0000 mL | Freq: Once | INTRAVENOUS | Status: AC | PRN
  Administered 2021-07-27: 20 mL via INTRAVENOUS

## 2021-07-27 MED ORDER — FLUTICASONE PROPIONATE 50 MCG/ACT NA SUSP: 2.0000 | Freq: Every day | NASAL | 11 refills | Status: AC

## 2021-07-30 DIAGNOSIS — Z1211 Encounter for screening for malignant neoplasm of colon: Secondary | ICD-10-CM | POA: Diagnosis not present

## 2021-08-06 LAB — COLOGUARD: COLOGUARD: NEGATIVE

## 2021-09-08 ENCOUNTER — Encounter: Payer: Self-pay | Admitting: Internal Medicine

## 2021-09-09 MED ORDER — ROSUVASTATIN CALCIUM 5 MG PO TABS: 5.0000 mg | ORAL_TABLET | Freq: Every day | ORAL | 2 refills | Status: DC

## 2021-09-09 MED ORDER — METFORMIN HCL 500 MG PO TABS: ORAL_TABLET | ORAL | 2 refills | Status: DC

## 2021-09-09 MED ORDER — FUROSEMIDE 20 MG PO TABS: 20.0000 mg | ORAL_TABLET | Freq: Every day | ORAL | 0 refills | Status: DC | PRN

## 2021-09-10 ENCOUNTER — Other Ambulatory Visit: Payer: Self-pay | Admitting: Internal Medicine

## 2021-09-10 ENCOUNTER — Encounter: Payer: Self-pay | Admitting: Internal Medicine

## 2021-09-10 MED ORDER — TAMSULOSIN HCL 0.4 MG PO CAPS: 0.4000 mg | ORAL_CAPSULE | Freq: Every day | ORAL | 2 refills | Status: DC

## 2021-09-13 ENCOUNTER — Other Ambulatory Visit: Payer: Self-pay | Admitting: Internal Medicine

## 2021-11-15 ENCOUNTER — Telehealth: Payer: Self-pay | Admitting: Internal Medicine

## 2021-12-06 ENCOUNTER — Encounter: Payer: Self-pay | Admitting: Internal Medicine

## 2021-12-13 DIAGNOSIS — R310 Gross hematuria: Secondary | ICD-10-CM | POA: Diagnosis not present

## 2021-12-13 DIAGNOSIS — R972 Elevated prostate specific antigen [PSA]: Secondary | ICD-10-CM | POA: Diagnosis not present

## 2021-12-13 DIAGNOSIS — R351 Nocturia: Secondary | ICD-10-CM | POA: Diagnosis not present

## 2021-12-13 DIAGNOSIS — N401 Enlarged prostate with lower urinary tract symptoms: Secondary | ICD-10-CM | POA: Diagnosis not present

## 2021-12-18 ENCOUNTER — Other Ambulatory Visit: Payer: Self-pay | Admitting: Internal Medicine

## 2021-12-27 DIAGNOSIS — H903 Sensorineural hearing loss, bilateral: Secondary | ICD-10-CM | POA: Diagnosis not present

## 2021-12-29 DIAGNOSIS — R310 Gross hematuria: Secondary | ICD-10-CM | POA: Diagnosis not present

## 2021-12-29 DIAGNOSIS — N281 Cyst of kidney, acquired: Secondary | ICD-10-CM | POA: Diagnosis not present

## 2021-12-29 DIAGNOSIS — K760 Fatty (change of) liver, not elsewhere classified: Secondary | ICD-10-CM | POA: Diagnosis not present

## 2022-01-10 DIAGNOSIS — R310 Gross hematuria: Secondary | ICD-10-CM | POA: Diagnosis not present

## 2022-01-12 ENCOUNTER — Encounter: Payer: Self-pay | Admitting: Internal Medicine

## 2022-01-21 ENCOUNTER — Encounter: Payer: Self-pay | Admitting: Internal Medicine

## 2022-05-29 ENCOUNTER — Telehealth: Payer: Self-pay | Admitting: Internal Medicine

## 2022-05-29 DIAGNOSIS — E1169 Type 2 diabetes mellitus with other specified complication: Secondary | ICD-10-CM

## 2022-05-29 DIAGNOSIS — R972 Elevated prostate specific antigen [PSA]: Secondary | ICD-10-CM

## 2022-05-29 DIAGNOSIS — I10 Essential (primary) hypertension: Secondary | ICD-10-CM

## 2022-05-29 DIAGNOSIS — E785 Hyperlipidemia, unspecified: Secondary | ICD-10-CM

## 2022-05-29 NOTE — Telephone Encounter (Signed)
Pt has Medicare.. pls advise../lmb 

## 2022-05-29 NOTE — Telephone Encounter (Signed)
Patient stated that he would like to come in and have labs done before CPE. I advised patient I would send a message to clinical team and have someone call in regards to getting this set up.   Please call patient, thank you.

## 2022-05-31 NOTE — Addendum Note (Signed)
Addended by: Cassandria Anger on: 05/31/2022 07:20 AM   Modules accepted: Orders

## 2022-05-31 NOTE — Telephone Encounter (Signed)
Okay.  Thanks.

## 2022-05-31 NOTE — Telephone Encounter (Signed)
Notified pt MD place orders will come on the 16th.Marland KitchenJohny Chess

## 2022-06-06 ENCOUNTER — Other Ambulatory Visit (INDEPENDENT_AMBULATORY_CARE_PROVIDER_SITE_OTHER): Payer: Medicare PPO

## 2022-06-06 ENCOUNTER — Ambulatory Visit (INDEPENDENT_AMBULATORY_CARE_PROVIDER_SITE_OTHER): Payer: Medicare PPO

## 2022-06-06 VITALS — Ht 68.0 in | Wt 250.0 lb

## 2022-06-06 DIAGNOSIS — E785 Hyperlipidemia, unspecified: Secondary | ICD-10-CM

## 2022-06-06 DIAGNOSIS — E669 Obesity, unspecified: Secondary | ICD-10-CM | POA: Diagnosis not present

## 2022-06-06 DIAGNOSIS — I10 Essential (primary) hypertension: Secondary | ICD-10-CM

## 2022-06-06 DIAGNOSIS — E1169 Type 2 diabetes mellitus with other specified complication: Secondary | ICD-10-CM

## 2022-06-06 DIAGNOSIS — R972 Elevated prostate specific antigen [PSA]: Secondary | ICD-10-CM

## 2022-06-06 DIAGNOSIS — Z0000 Encounter for general adult medical examination without abnormal findings: Secondary | ICD-10-CM

## 2022-06-06 LAB — COMPREHENSIVE METABOLIC PANEL
ALT: 35 U/L (ref 0–53)
AST: 27 U/L (ref 0–37)
Albumin: 4.2 g/dL (ref 3.5–5.2)
Alkaline Phosphatase: 54 U/L (ref 39–117)
BUN: 13 mg/dL (ref 6–23)
CO2: 29 mEq/L (ref 19–32)
Calcium: 9.3 mg/dL (ref 8.4–10.5)
Chloride: 101 mEq/L (ref 96–112)
Creatinine, Ser: 1.02 mg/dL (ref 0.40–1.50)
GFR: 73.68 mL/min (ref >60.00)
Glucose, Bld: 257 mg/dL — ABNORMAL HIGH (ref 70–99)
Potassium: 4.3 mEq/L (ref 3.5–5.1)
Sodium: 139 mEq/L (ref 135–145)
Total Bilirubin: 0.6 mg/dL (ref 0.2–1.2)
Total Protein: 6.7 g/dL (ref 6.0–8.3)

## 2022-06-06 LAB — LIPID PANEL
Cholesterol: 137 mg/dL (ref 0–200)
HDL: 35.3 mg/dL — ABNORMAL LOW (ref >39.00)
LDL Cholesterol: 64 mg/dL (ref 0–99)
NonHDL: 101.9
Total CHOL/HDL Ratio: 4
Triglycerides: 192 mg/dL — ABNORMAL HIGH (ref 0.0–149.0)
VLDL: 38.4 mg/dL (ref 0.0–40.0)

## 2022-06-06 LAB — CBC WITH DIFFERENTIAL/PLATELET
Basophils Absolute: 0.1 10*3/uL (ref 0.0–0.1)
Basophils Relative: 1.1 % (ref 0.0–3.0)
Eosinophils Absolute: 0.1 10*3/uL (ref 0.0–0.7)
Eosinophils Relative: 1.7 % (ref 0.0–5.0)
HCT: 42.9 % (ref 39.0–52.0)
Hemoglobin: 15.2 g/dL (ref 13.0–17.0)
Lymphocytes Relative: 28.8 % (ref 12.0–46.0)
Lymphs Abs: 1.9 10*3/uL (ref 0.7–4.0)
MCHC: 35.4 g/dL (ref 30.0–36.0)
MCV: 88 fl (ref 78.0–100.0)
Monocytes Absolute: 0.4 10*3/uL (ref 0.1–1.0)
Monocytes Relative: 6.7 % (ref 3.0–12.0)
Neutro Abs: 4 10*3/uL (ref 1.4–7.7)
Neutrophils Relative %: 61.7 % (ref 43.0–77.0)
Platelets: 174 10*3/uL (ref 150.0–400.0)
RBC: 4.88 Mil/uL (ref 4.22–5.81)
RDW: 13.5 % (ref 11.5–15.5)
WBC: 6.5 10*3/uL (ref 4.0–10.5)

## 2022-06-06 LAB — URINALYSIS
Bilirubin Urine: NEGATIVE
Hgb urine dipstick: NEGATIVE
Ketones, ur: NEGATIVE
Leukocytes,Ua: NEGATIVE
Nitrite: NEGATIVE
Specific Gravity, Urine: >=1.03 — AB (ref 1.000–1.030)
Urine Glucose: 250 — AB
Urobilinogen, UA: 0.2 (ref 0.0–1.0)
pH: 5.5 (ref 5.0–8.0)

## 2022-06-06 LAB — TSH: TSH: 3.8 u[IU]/mL (ref 0.35–5.50)

## 2022-06-06 LAB — PSA: PSA: 15.76 ng/mL — ABNORMAL HIGH (ref 0.10–4.00)

## 2022-06-06 LAB — HEMOGLOBIN A1C: Hgb A1c MFr Bld: 8.5 % — ABNORMAL HIGH (ref 4.6–6.5)

## 2022-06-06 NOTE — Patient Instructions (Signed)
Drew Hansen , Thank you for taking time to come for your Medicare Wellness Visit. I appreciate your ongoing commitment to your health goals. Please review the following plan we discussed and let me know if I can assist you in the future.   These are the goals we discussed:  Goals      Client understands the importance of follow-up with providers by attending scheduled visits.        This is a list of the screening recommended for you and due dates:  Health Maintenance  Topic Date Due   Complete foot exam   Never done   Eye exam for diabetics  Never done   Yearly kidney health urinalysis for diabetes  Never done   Hepatitis C Screening: USPSTF Recommendation to screen - Ages 15-79 yo.  Never done   Zoster (Shingles) Vaccine (1 of 2) Never done   COVID-19 Vaccine (3 - 2023-24 season) 01/20/2022   DTaP/Tdap/Td vaccine (2 - Td or Tdap) 10/16/2022   Hemoglobin A1C  12/05/2022   Yearly kidney function blood test for diabetes  06/07/2023   Medicare Annual Wellness Visit  06/07/2023   Cologuard (Stool DNA test)  07/30/2024   Pneumonia Vaccine  Completed   HPV Vaccine  Aged Out   Flu Shot  Discontinued    Advanced directives: No  Conditions/risks identified: Yes  Next appointment: Follow up in one year for your annual wellness visit.   Preventive Care 38 Years and Older, Male  Preventive care refers to lifestyle choices and visits with your health care provider that can promote health and wellness. What does preventive care include? A yearly physical exam. This is also called an annual well check. Dental exams once or twice a year. Routine eye exams. Ask your health care provider how often you should have your eyes checked. Personal lifestyle choices, including: Daily care of your teeth and gums. Regular physical activity. Eating a healthy diet. Avoiding tobacco and drug use. Limiting alcohol use. Practicing safe sex. Taking low doses of aspirin every day. Taking vitamin  and mineral supplements as recommended by your health care provider. What happens during an annual well check? The services and screenings done by your health care provider during your annual well check will depend on your age, overall health, lifestyle risk factors, and family history of disease. Counseling  Your health care provider may ask you questions about your: Alcohol use. Tobacco use. Drug use. Emotional well-being. Home and relationship well-being. Sexual activity. Eating habits. History of falls. Memory and ability to understand (cognition). Work and work Statistician. Screening  You may have the following tests or measurements: Height, weight, and BMI. Blood pressure. Lipid and cholesterol levels. These may be checked every 5 years, or more frequently if you are over 65 years old. Skin check. Lung cancer screening. You may have this screening every year starting at age 29 if you have a 30-pack-year history of smoking and currently smoke or have quit within the past 15 years. Fecal occult blood test (FOBT) of the stool. You may have this test every year starting at age 29. Flexible sigmoidoscopy or colonoscopy. You may have a sigmoidoscopy every 5 years or a colonoscopy every 10 years starting at age 102. Prostate cancer screening. Recommendations will vary depending on your family history and other risks. Hepatitis C blood test. Hepatitis B blood test. Sexually transmitted disease (STD) testing. Diabetes screening. This is done by checking your blood sugar (glucose) after you have not eaten for a  while (fasting). You may have this done every 1-3 years. Abdominal aortic aneurysm (AAA) screening. You may need this if you are a current or former smoker. Osteoporosis. You may be screened starting at age 39 if you are at high risk. Talk with your health care provider about your test results, treatment options, and if necessary, the need for more tests. Vaccines  Your health care  provider may recommend certain vaccines, such as: Influenza vaccine. This is recommended every year. Tetanus, diphtheria, and acellular pertussis (Tdap, Td) vaccine. You may need a Td booster every 10 years. Zoster vaccine. You may need this after age 60. Pneumococcal 13-valent conjugate (PCV13) vaccine. One dose is recommended after age 37. Pneumococcal polysaccharide (PPSV23) vaccine. One dose is recommended after age 44. Talk to your health care provider about which screenings and vaccines you need and how often you need them. This information is not intended to replace advice given to you by your health care provider. Make sure you discuss any questions you have with your health care provider. Document Released: 06/04/2015 Document Revised: 01/26/2016 Document Reviewed: 03/09/2015 Elsevier Interactive Patient Education  2017 [employer] Prevention in the Home Falls can cause injuries. They can happen to people of all ages. There are many things you can do to make your home safe and to help prevent falls. What can I do on the outside of my home? Regularly fix the edges of walkways and driveways and fix any cracks. Remove anything that might make you trip as you walk through a door, such as a raised step or threshold. Trim any bushes or trees on the path to your home. Use bright outdoor lighting. Clear any walking paths of anything that might make someone trip, such as rocks or tools. Regularly check to see if handrails are loose or broken. Make sure that both sides of any steps have handrails. Any raised decks and porches should have guardrails on the edges. Have any leaves, snow, or ice cleared regularly. Use sand or salt on walking paths during winter. Clean up any spills in your garage right away. This includes oil or grease spills. What can I do in the bathroom? Use night lights. Install grab bars by the toilet and in the tub and shower. Do not use towel bars as grab  bars. Use non-skid mats or decals in the tub or shower. If you need to sit down in the shower, use a plastic, non-slip stool. Keep the floor dry. Clean up any water that spills on the floor as soon as it happens. Remove soap buildup in the tub or shower regularly. Attach bath mats securely with double-sided non-slip rug tape. Do not have throw rugs and other things on the floor that can make you trip. What can I do in the bedroom? Use night lights. Make sure that you have a light by your bed that is easy to reach. Do not use any sheets or blankets that are too big for your bed. They should not hang down onto the floor. Have a firm chair that has side arms. You can use this for support while you get dressed. Do not have throw rugs and other things on the floor that can make you trip. What can I do in the kitchen? Clean up any spills right away. Avoid walking on wet floors. Keep items that you use a lot in easy-to-reach places. If you need to reach something above you, use a strong step stool that has a grab  bar. Keep electrical cords out of the way. Do not use floor polish or wax that makes floors slippery. If you must use wax, use non-skid floor wax. Do not have throw rugs and other things on the floor that can make you trip. What can I do with my stairs? Do not leave any items on the stairs. Make sure that there are handrails on both sides of the stairs and use them. Fix handrails that are broken or loose. Make sure that handrails are as long as the stairways. Check any carpeting to make sure that it is firmly attached to the stairs. Fix any carpet that is loose or worn. Avoid having throw rugs at the top or bottom of the stairs. If you do have throw rugs, attach them to the floor with carpet tape. Make sure that you have a light switch at the top of the stairs and the bottom of the stairs. If you do not have them, ask someone to add them for you. What else can I do to help prevent  falls? Wear shoes that: Do not have high heels. Have rubber bottoms. Are comfortable and fit you well. Are closed at the toe. Do not wear sandals. If you use a stepladder: Make sure that it is fully opened. Do not climb a closed stepladder. Make sure that both sides of the stepladder are locked into place. Ask someone to hold it for you, if possible. Clearly mark and make sure that you can see: Any grab bars or handrails. First and last steps. Where the edge of each step is. Use tools that help you move around (mobility aids) if they are needed. These include: Canes. Walkers. Scooters. Crutches. Turn on the lights when you go into a dark area. Replace any light bulbs as soon as they burn out. Set up your furniture so you have a clear path. Avoid moving your furniture around. If any of your floors are uneven, fix them. If there are any pets around you, be aware of where they are. Review your medicines with your doctor. Some medicines can make you feel dizzy. This can increase your chance of falling. Ask your doctor what other things that you can do to help prevent falls. This information is not intended to replace advice given to you by your health care provider. Make sure you discuss any questions you have with your health care provider. Document Released: 03/04/2009 Document Revised: 10/14/2015 Document Reviewed: 06/12/2014 Elsevier Interactive Patient Education  2017 [employer].

## 2022-06-06 NOTE — Progress Notes (Addendum)
Virtual Visit via Telephone Note  I connected with  Power Arnet on 06/06/22 at  3:45 PM EST by telephone and verified that I am speaking with the correct person using two identifiers.  Location: Patient: Home Provider: [place] Persons participating in the virtual visit: [place]   I discussed the limitations, risks, security and privacy concerns of performing an evaluation and management service by telephone and the availability of in person appointments. The patient expressed understanding and agreed to proceed.  Interactive audio and video telecommunications were attempted between this nurse and patient, however failed, due to patient having technical difficulties OR patient did not have access to video capability.  We continued and completed visit with audio only.  Some vital signs may be absent or patient reported.   Sheral Flow, LPN  Subjective:   Bosie Summit is a 72 y.o. male who presents for Medicare Annual/Subsequent preventive examination.  Review of Systems     Cardiac Risk Factors include: advanced age (>46men, >3 women);diabetes mellitus;dyslipidemia;hypertension;male gender;obesity (BMI >30kg/m2)     Objective:    Today's Vitals   06/06/22 1550 06/06/22 1551  Weight: 250 lb (113.4 kg)   Height: 5\' 8"  (1.727 m)   PainSc: 0-No pain 0-No pain   Body mass index is 38.01 kg/m.     06/06/2022    3:53 PM  Advanced Directives  Does Patient Have a Medical Advance Directive? No  Would patient like information on creating a medical advance directive? No - Patient declined    Current Medications (verified) Outpatient Encounter Medications as of 06/06/2022  Medication Sig   aspirin 81 MG EC tablet Take 1 tablet (81 mg total) by mouth daily. Swallow whole.   b complex vitamins tablet Take 1 tablet by mouth daily.   cholecalciferol (VITAMIN D) 1000 units tablet Take 2 tablets (2,000 Units total) by mouth daily.    fluticasone (FLONASE) 50 MCG/ACT nasal spray Place 2 sprays into both nostrils daily.   furosemide (LASIX) 20 MG tablet TAKE 1 TO 2 TABLETS(20 TO 40 MG) BY MOUTH DAILY AS NEEDED FOR EDEMA   hydrochlorothiazide (MICROZIDE) 12.5 MG capsule TAKE 1 CAPSULE(12.5 MG) BY MOUTH DAILY   ibuprofen (ADVIL,MOTRIN) 600 MG tablet Take 600 mg by mouth 2 (two) times daily as needed.     loratadine (CLARITIN) 10 MG tablet Take 1 tablet (10 mg total) by mouth daily.   metFORMIN (GLUCOPHAGE) 500 MG tablet TAKE 1 TABLET(500 MG) BY MOUTH DAILY WITH BREAKFAST   rosuvastatin (CRESTOR) 5 MG tablet Take 1 tablet (5 mg total) by mouth daily.   Semaglutide,0.25 or 0.5MG /DOS, (OZEMPIC, 0.25 OR 0.5 MG/DOSE,) 2 MG/1.5ML SOPN Inject 0.25 mg into the skin once a week.   tamsulosin (FLOMAX) 0.4 MG CAPS capsule Take 1 capsule (0.4 mg total) by mouth daily.   No facility-administered encounter medications on file as of 06/06/2022.    Allergies (verified) Ciprofloxacin   History: Past Medical History:  Diagnosis Date   BPH (benign prostatic hyperplasia) 2007   Elevated PSA, s/p bx Dr Karsten Ro   Past Surgical History:  Procedure Laterality Date   PROSTATE BIOPSY     Dr Karsten Ro - he did not go?   Family History  Problem Relation Age of Onset   Cancer Mother        Bone   Diabetes Other    Social History   Socioeconomic History   Marital status: Married    Spouse name: Not on file   Number of children: Not  on file   Years of education: Not on file   Highest education level: Not on file  Occupational History   Occupation: Professor    Fish farm manager: [employer]  Tobacco Use   Smoking status: Former   Smokeless tobacco: Never  Substance and Sexual Activity   Alcohol use: No   Drug use: No   Sexual activity: Yes  Other Topics Concern   Not on file  Social History Narrative   Regular exercise - yes - ping pong and Bikram yoga, walking   Social Determinants of Health   Financial Resource Strain:  Unknown (06/06/2022)   Overall Financial Resource Strain (CARDIA)    Difficulty of Paying Living Expenses: Patient refused  Food Insecurity: Unknown (06/06/2022)   Hunger Vital Sign    Worried About Running Out of Food in the Last Year: Patient refused    [place] in the Last Year: Patient refused  Transportation Needs: Unknown (06/06/2022)   PRAPARE - Transportation    Lack of Transportation (Medical): Patient refused    Lack of Transportation (Non-Medical): Patient refused  Physical Activity: Unknown (06/06/2022)   Exercise Vital Sign    Days of Exercise per Week: Patient refused    Minutes of Exercise per Session: Patient refused  Stress: Unknown (06/06/2022)   [place] of Stress : Patient refused  Social Connections: Unknown (06/06/2022)   Social Connection and Isolation Panel [NHANES]    Frequency of Communication with Friends and Family: Patient refused    Frequency of Social Gatherings with Friends and Family: Patient refused    Attends Religious Services: Not on Advertising copywriter or Organizations: Patient refused    Attends Archivist Meetings: Patient refused    Marital Status: Patient refused    Tobacco Counseling Counseling given: Not Answered   Clinical Intake:  Pre-visit preparation completed: Yes  Pain : No/denies pain Pain Score: 0-No pain     BMI - recorded: 38.01 Nutritional Status: BMI > 30  Obese Nutritional Risks: None Diabetes: No  How often do you need to have someone help you when you read instructions, pamphlets, or other written materials from your doctor or pharmacy?: 1 - Never 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  No  Does the patient have any non-healing wounds?  No  Has the patient had any unintentional weight loss or  weight gain?  No   Diabetes:  Is the patient diabetic?  Yes  If diabetic, was a CBG obtained today?  No  Did the patient bring in their glucometer from home?  No  How often do you monitor your CBG's? No.   Financial Strains and Diabetes Management:  Are you having any financial strains with the device, your supplies or your medication? No .  Does the patient want to be seen by Chronic Care Management for management of their diabetes?  No  Would the patient like to be referred to a Nutritionist or for Diabetic Management?  No   Diabetic Exams:  Diabetic Eye Exam: Overdue for diabetic eye exam. Pt has been advised about the importance in completing this exam. Patient advised to call and schedule an eye exam. Diabetic Foot Exam: Overdue, Pt has been advised about the importance in completing this exam. Pt is scheduled  for diabetic foot exam on 06/08/2022.   Interpreter Needed?: No  Information entered by :: Lisette Abu, LPN.   Activities of Daily Living    06/06/2022    3:53 PM 06/02/2022   10:23 AM  In your present state of health, do you have any difficulty performing the following activities:  Hearing? 0 0  Vision? 0 0  Difficulty concentrating or making decisions? 0 0  Walking or climbing stairs? 0 0  Dressing or bathing? 0 0  Doing errands, shopping? 0 0  Preparing Food and eating ? N N  Using the Toilet? N N  In the past six months, have you accidently leaked urine? N N  Do you have problems with loss of bowel control? N N  Managing your Medications? N N  Managing your Finances? N N  Housekeeping or managing your Housekeeping? N N    Patient Care Team: Plotnikov, Evie Lacks, MD as PCP - General Meroth, Alford Highland, AUD as Consulting Physician (Audiology)  Indicate any recent Medical Services you may have received from other than [facility] providers in the past year (date may be approximate).     Assessment:   This is a routine wellness examination for  [place].  Hearing/Vision screen Hearing Screening - Comments:: Patient has hearing difficulties; goes to [facility]  Dietary issues and exercise activities discussed: Current Exercise Habits: The patient does not participate in regular exercise at present, Exercise limited by: None identified   Goals Addressed             This Visit's Progress    Client understands the importance of follow-up with providers by attending scheduled visits.        Depression Screen    06/06/2022    3:58 PM 04/25/2021    2:23 PM 10/21/2018    9:19 AM 06/27/2017    3:31 PM 06/22/2016    9:04 AM  PHQ 2/9 Scores  PHQ - 2 Score 0 0 0 0 0  PHQ- 9 Score  1       Fall Risk    06/06/2022    3:53 PM 06/02/2022   10:23 AM 04/25/2021    2:23 PM 03/03/2019    7:53 AM 10/21/2018    9:19 AM  [place] in the past year? 0 0 0 0 0  Number falls in past yr: 0  0  0  Injury with Fall? 0  0  0  Risk for fall due to : No Fall Risks  No Fall Risks    Follow up Falls prevention discussed   Falls evaluation completed     FALL RISK PREVENTION PERTAINING TO THE HOME:  Any stairs in or around the home? Yes  If so, are there any without handrails? No  Home free of loose throw rugs in walkways, pet beds, electrical cords, etc? Yes  Adequate lighting in your home to reduce risk of falls? Yes   ASSISTIVE DEVICES UTILIZED TO PREVENT FALLS:  Life alert? No  Use of a cane, walker or w/c? No  Grab bars in the bathroom? No  Shower chair or bench in shower? No  Elevated toilet seat or a handicapped toilet? No   TIMED UP AND GO:  Was the test performed? No . Phone Visit  Cognitive Function:        06/06/2022    3:58 PM  6CIT Screen  What Year? 0 points  What month? 0 points  What time? 0 points  Count  back from 20 0 points  Months in reverse 0 points  Repeat phrase 0 points  Total Score 0 points    Immunizations Immunization History  Administered Date(s) Administered   Fluad Quad(high Dose 65+)  03/03/2019   Influenza, High Dose Seasonal PF 04/04/2018   PFIZER(Purple Top)SARS-COV-2 Vaccination 07/12/2019, 08/05/2019   Pneumococcal Conjugate-13 06/22/2016   Pneumococcal Polysaccharide-23 10/03/2017   Tdap 10/15/2012    TDAP status: Up to date  Flu Vaccine status: Declined, Education has been provided regarding the importance of this vaccine but patient still declined. Advised may receive this vaccine at local pharmacy or Health Dept. Aware to provide a copy of the vaccination record if obtained from local pharmacy or Health Dept. Verbalized acceptance and understanding.  Pneumococcal vaccine status: Up to date  Covid-19 vaccine status: Completed vaccines  Qualifies for Shingles Vaccine? Yes   Zostavax completed No   Shingrix Completed?: No.    Education has been provided regarding the importance of this vaccine. Patient has been advised to call insurance company to determine out of pocket expense if they have not yet received this vaccine. Advised may also receive vaccine at local pharmacy or Health Dept. Verbalized acceptance and understanding.  Screening Tests Health Maintenance  Topic Date Due   FOOT EXAM  Never done   OPHTHALMOLOGY EXAM  Never done   Diabetic kidney evaluation - Urine ACR  Never done   Hepatitis C Screening  Never done   Zoster Vaccines- Shingrix (1 of 2) Never done   COVID-19 Vaccine (3 - 2023-24 season) 01/20/2022   DTaP/Tdap/Td (2 - Td or Tdap) 10/16/2022   HEMOGLOBIN A1C  12/05/2022   Diabetic kidney evaluation - eGFR measurement  06/07/2023   Medicare Annual Wellness (AWV)  06/07/2023   Fecal DNA (Cologuard)  07/30/2024   Pneumonia Vaccine 69+ Years old  Completed   HPV VACCINES  Aged Out   INFLUENZA VACCINE  Discontinued    Health Maintenance  Health Maintenance Due  Topic Date Due   FOOT EXAM  Never done   OPHTHALMOLOGY EXAM  Never done   Diabetic kidney evaluation - Urine ACR  Never done   Hepatitis C Screening  Never done   Zoster  Vaccines- Shingrix (1 of 2) Never done   COVID-19 Vaccine (3 - 2023-24 season) 01/20/2022    Colorectal cancer screening: Type of screening: Cologuard. Completed 08/06/2021. Repeat every 3 years  Lung Cancer Screening: (Low Dose CT Chest recommended if Age 30-80 years, 30 pack-year currently smoking OR have quit w/in 15years.) does not qualify.   Lung Cancer Screening Referral: no  Additional Screening:  Hepatitis C Screening: does qualify; Completed: No  Vision Screening: Recommended annual ophthalmology exams for early detection of glaucoma and other disorders of the eye. Is the patient up to date with their annual eye exam?  No  Who is the provider or what is the name of the office in which the patient attends annual eye exams? Declined If pt is not established with a provider, would they like to be referred to a provider to establish care? No .   Dental Screening: Recommended annual dental exams for proper oral hygiene  Community Resource Referral / Chronic Care Management: CRR required this visit?  No   CCM required this visit?  No      Plan:     I have personally reviewed and noted the following in the patient's chart:   Medical and social history Use of alcohol, tobacco or illicit drugs  Current  medications and supplements including opioid prescriptions. Patient is not currently taking opioid prescriptions. Functional ability and status Nutritional status Physical activity Advanced directives List of other physicians Hospitalizations, surgeries, and ER visits in previous 12 months Vitals Screenings to include cognitive, depression, and falls Referrals and appointments  In addition, I have reviewed and discussed with patient certain preventive protocols, quality metrics, and best practice recommendations. A written personalized care plan for preventive services as well as general preventive health recommendations were provided to patient.     Mickeal Needy,  LPN   11/25/2374   Nurse Notes: N/A   Medical screening examination/treatment/procedure(s) were performed by non-physician practitioner and as supervising physician I was immediately available for consultation/collaboration.  I agree with above. Jacinta Shoe, MD

## 2022-06-08 ENCOUNTER — Ambulatory Visit (INDEPENDENT_AMBULATORY_CARE_PROVIDER_SITE_OTHER): Payer: Medicare PPO | Admitting: Internal Medicine

## 2022-06-08 ENCOUNTER — Encounter: Payer: Self-pay | Admitting: Internal Medicine

## 2022-06-08 VITALS — BP 130/70 | HR 71 | Temp 97.7°F | Ht 68.0 in | Wt 262.0 lb

## 2022-06-08 DIAGNOSIS — E559 Vitamin D deficiency, unspecified: Secondary | ICD-10-CM | POA: Diagnosis not present

## 2022-06-08 DIAGNOSIS — I10 Essential (primary) hypertension: Secondary | ICD-10-CM

## 2022-06-08 DIAGNOSIS — E669 Obesity, unspecified: Secondary | ICD-10-CM

## 2022-06-08 DIAGNOSIS — E1169 Type 2 diabetes mellitus with other specified complication: Secondary | ICD-10-CM | POA: Diagnosis not present

## 2022-06-08 DIAGNOSIS — I2510 Atherosclerotic heart disease of native coronary artery without angina pectoris: Secondary | ICD-10-CM

## 2022-06-08 DIAGNOSIS — I2583 Coronary atherosclerosis due to lipid rich plaque: Secondary | ICD-10-CM | POA: Diagnosis not present

## 2022-06-08 MED ORDER — RYBELSUS 3 MG PO TABS: 3.0000 mg | ORAL_TABLET | Freq: Every day | ORAL | 1 refills | Status: DC

## 2022-06-08 MED ORDER — TADALAFIL 5 MG PO TABS: 5.0000 mg | ORAL_TABLET | Freq: Every day | ORAL | 11 refills | Status: DC

## 2022-06-08 MED ORDER — METFORMIN HCL 500 MG PO TABS: 500.0000 mg | ORAL_TABLET | Freq: Two times a day (BID) | ORAL | 3 refills | Status: DC

## 2022-06-08 NOTE — Assessment & Plan Note (Signed)
Cont on HCTZ 

## 2022-06-08 NOTE — Assessment & Plan Note (Signed)
On Vit D 

## 2022-06-08 NOTE — Assessment & Plan Note (Signed)
Worse Increase Metformin to bid Tart Rybelsus po RTC 1 month

## 2022-06-08 NOTE — Progress Notes (Signed)
Subjective:  Patient ID: Drew Hansen, male    DOB: Sep 15, 1950  Age: 72 y.o. MRN: [id number]  CC: Annual Exam (Physical )   HPI Jaron Czarnecki presents for DM, HTN, allergies, BPH  Outpatient Medications Prior to Visit  Medication Sig Dispense Refill   aspirin 81 MG EC tablet Take 1 tablet (81 mg total) by mouth daily. Swallow whole. 30 tablet 12   b complex vitamins tablet Take 1 tablet by mouth daily. 100 tablet 3   cholecalciferol (VITAMIN D) 1000 units tablet Take 2 tablets (2,000 Units total) by mouth daily. 100 tablet 5   fluticasone (FLONASE) 50 MCG/ACT nasal spray Place 2 sprays into both nostrils daily. 16 g 11   hydrochlorothiazide (MICROZIDE) 12.5 MG capsule TAKE 1 CAPSULE(12.5 MG) BY MOUTH DAILY 90 capsule 2   ibuprofen (ADVIL,MOTRIN) 600 MG tablet Take 600 mg by mouth 2 (two) times daily as needed.       rosuvastatin (CRESTOR) 5 MG tablet Take 1 tablet (5 mg total) by mouth daily. 90 tablet 2   tamsulosin (FLOMAX) 0.4 MG CAPS capsule Take 1 capsule (0.4 mg total) by mouth daily. 90 capsule 2   metFORMIN (GLUCOPHAGE) 500 MG tablet TAKE 1 TABLET(500 MG) BY MOUTH DAILY WITH BREAKFAST 90 tablet 2   Semaglutide,0.25 or 0.5MG /DOS, (OZEMPIC, 0.25 OR 0.5 MG/DOSE,) 2 MG/1.5ML SOPN Inject 0.25 mg into the skin once a week. 6 mL 2   tadalafil (CIALIS) 5 MG tablet Take 5 mg by mouth daily.     furosemide (LASIX) 20 MG tablet TAKE 1 TO 2 TABLETS(20 TO 40 MG) BY MOUTH DAILY AS NEEDED FOR EDEMA (Patient not taking: Reported on 06/08/2022) 180 tablet 0   loratadine (CLARITIN) 10 MG tablet Take 1 tablet (10 mg total) by mouth daily. (Patient not taking: Reported on 06/08/2022) 30 tablet 11   No facility-administered medications prior to visit.    ROS: Review of Systems  Constitutional:  Negative for appetite change, fatigue and unexpected weight change.  HENT:  Negative for congestion, nosebleeds, sneezing, sore throat and trouble swallowing.   Eyes:  Negative for itching and  visual disturbance.  Respiratory:  Negative for cough.   Cardiovascular:  Negative for chest pain, palpitations and leg swelling.  Gastrointestinal:  Negative for abdominal distention, blood in stool, diarrhea and nausea.  Genitourinary:  Positive for frequency and urgency. Negative for hematuria.  Musculoskeletal:  Negative for back pain, gait problem, joint swelling and neck pain.  Skin:  Negative for rash.  Neurological:  Negative for dizziness, tremors, speech difficulty and weakness.  Psychiatric/Behavioral:  Negative for agitation, dysphoric mood and sleep disturbance. The patient is not nervous/anxious.     Objective:  BP 130/70 (BP Location: Left Arm, Patient Position: Sitting, Cuff Size: Large)   Pulse 71   Temp 97.7 F (36.5 C) (Oral)   Ht 5\' 8"  (1.727 m)   Wt 262 lb (118.8 kg)   SpO2 94%   BMI 39.84 kg/m   BP Readings from Last 3 Encounters:  06/08/22 130/70  04/25/21 120/62  04/07/20 120/80    Wt Readings from Last 3 Encounters:  06/08/22 262 lb (118.8 kg)  06/06/22 250 lb (113.4 kg)  04/25/21 259 lb 3.2 oz (117.6 kg)    Physical Exam Constitutional:      General: He is not in acute distress.    Appearance: He is well-developed. He is obese.     Comments: NAD  Eyes:     Conjunctiva/sclera: Conjunctivae normal.  Pupils: Pupils are equal, round, and reactive to light.  Neck:     Thyroid: No thyromegaly.     Vascular: No JVD.  Cardiovascular:     Rate and Rhythm: Normal rate and regular rhythm.     Heart sounds: Normal heart sounds. No murmur heard.    No friction rub. No gallop.  Pulmonary:     Effort: Pulmonary effort is normal. No respiratory distress.     Breath sounds: Normal breath sounds. No wheezing or rales.  Chest:     Chest wall: No tenderness.  Abdominal:     General: Bowel sounds are normal. There is no distension.     Palpations: Abdomen is soft. There is no mass.     Tenderness: There is no abdominal tenderness. There is no guarding  or rebound.  Musculoskeletal:        General: No tenderness. Normal range of motion.     Cervical back: Normal range of motion.  Lymphadenopathy:     Cervical: No cervical adenopathy.  Skin:    General: Skin is warm and dry.     Findings: No rash.  Neurological:     Mental Status: He is alert and oriented to person, place, and time.     Cranial Nerves: No cranial nerve deficit.     Motor: No abnormal muscle tone.     Coordination: Coordination normal.     Gait: Gait normal.     Deep Tendon Reflexes: Reflexes are normal and symmetric.  Psychiatric:        Behavior: Behavior normal.        Thought Content: Thought content normal.        Judgment: Judgment normal.     Lab Results  Component Value Date   WBC 6.5 06/06/2022   HGB 15.2 06/06/2022   HCT 42.9 06/06/2022   PLT 174.0 06/06/2022   GLUCOSE 257 (H) 06/06/2022   CHOL 137 06/06/2022   TRIG 192.0 (H) 06/06/2022   HDL 35.30 (L) 06/06/2022   LDLDIRECT 102.0 06/20/2016   LDLCALC 64 06/06/2022   ALT 35 06/06/2022   AST 27 06/06/2022   NA 139 06/06/2022   K 4.3 06/06/2022   CL 101 06/06/2022   CREATININE 1.02 06/06/2022   BUN 13 06/06/2022   CO2 29 06/06/2022   TSH 3.80 06/06/2022   PSA 15.76 (H) 06/06/2022   HGBA1C 8.5 (H) 06/06/2022    MR PROSTATE W WO CONTRAST  Result Date: 07/27/2021 CLINICAL DATA:  Elevated PSA.  Atypical glands on prior biopsy. EXAM: MR PROSTATE WITHOUT AND WITH CONTRAST TECHNIQUE: Multiplanar multisequence MRI images were obtained of the pelvis centered about the prostate. Pre and post contrast images were obtained. CONTRAST:  36mL MULTIHANCE GADOBENATE DIMEGLUMINE 529 MG/ML IV SOLN COMPARISON:  None. FINDINGS: Prostate: -- Peripheral Zone: A 6 mm T2 hypointense nodule is seen in the right posteromedial mid gland, which has well-circumscribed margins this measures 6 mm on image 50/9. This nodule shows moderate ADC hypointensity, minimal DWI hyperintensity, and early focal contrast enhancement.  PI-RADS 4 -- Transition/Central Zone: Moderate to markedly enlarged with involvement by BPH nodules. No nodules with suspicious characteristics on T2-weighted or diffusion imaging. -- Measurements/Volume:  6.3 by 6.0 x 7.1 cm (volume = 140 cm^3) Transcapsular spread:  Absent Seminal vesicle involvement:  Absent Neurovascular bundle involvement:  Absent Pelvic adenopathy: 9 mm right pelvic sidewall lymph node seen just below the iliac bifurcation. No pathologically enlarged lymph nodes identified. Bone metastasis: None visualized Other: Diffuse bladder wall  thickening with multiple small diverticula, consistent with chronic bladder outlet obstruction. IMPRESSION: 6 mm peripheral zone nodule in the right posteromedial mid gland, suspicious for high-grade carcinoma. PI-RADS 4 (v2.1): High (clinically significant cancer likely) Nonspecific 9 mm right pelvic sidewall lymph node just below the iliac bifurcation. No pathologically enlarged lymph nodes identified. (I have post-processed this exam in the DynaCAD application for potential fusion-guided biopsy.) Electronically Signed   By: Danae Orleans M.D.   On: 07/27/2021 15:19    Assessment & Plan:   Problem List Items Addressed This Visit       Cardiovascular and Mediastinum   HTN (hypertension) - Primary    Cont on HCTZ      Relevant Medications   tadalafil (CIALIS) 5 MG tablet   CAD (coronary artery disease)    On baby aspirin.  Statin - Crestor.  Cardiology consultation advised.      Relevant Medications   tadalafil (CIALIS) 5 MG tablet     Endocrine   Diabetes mellitus type 2 in obese (HCC)    Worse Increase Metformin to bid Tart Rybelsus po RTC 1 month      Relevant Medications   metFORMIN (GLUCOPHAGE) 500 MG tablet   Semaglutide (RYBELSUS) 3 MG TABS   Other Relevant Orders   Comprehensive metabolic panel   Hemoglobin A1c     Other   Vitamin D deficiency    On Vit D         Meds ordered this encounter  Medications    metFORMIN (GLUCOPHAGE) 500 MG tablet    Sig: Take 1 tablet (500 mg total) by mouth 2 (two) times daily with a meal.    Dispense:  180 tablet    Refill:  3   Semaglutide (RYBELSUS) 3 MG TABS    Sig: Take 3 mg by mouth daily.    Dispense:  30 tablet    Refill:  1   tadalafil (CIALIS) 5 MG tablet    Sig: Take 1 tablet (5 mg total) by mouth daily.    Dispense:  30 tablet    Refill:  11      Follow-up: Return in about 3 months (around 09/07/2022) for a follow-up visit.  Sonda Primes, MD

## 2022-06-08 NOTE — Assessment & Plan Note (Signed)
On baby aspirin.  Statin - Crestor.  Cardiology consultation advised.

## 2022-06-15 ENCOUNTER — Ambulatory Visit: Payer: Medicare PPO | Admitting: Internal Medicine

## 2022-06-15 ENCOUNTER — Encounter: Payer: Self-pay | Admitting: Internal Medicine

## 2022-06-15 VITALS — BP 118/78 | HR 70 | Temp 97.9°F | Ht 68.0 in | Wt 262.0 lb

## 2022-06-15 DIAGNOSIS — E669 Obesity, unspecified: Secondary | ICD-10-CM | POA: Diagnosis not present

## 2022-06-15 DIAGNOSIS — E1169 Type 2 diabetes mellitus with other specified complication: Secondary | ICD-10-CM | POA: Diagnosis not present

## 2022-06-15 MED ORDER — RYBELSUS 7 MG PO TABS: 1.0000 | ORAL_TABLET | Freq: Every day | ORAL | 3 refills | Status: DC

## 2022-06-15 NOTE — Assessment & Plan Note (Addendum)
Will increase Rybelsus after 4 wks on 3 mg/d RTC 3 months Pt declined a glucometer Rx

## 2022-06-15 NOTE — Progress Notes (Signed)
Subjective:  Patient ID: Drew Hansen, male    DOB: June 26, 1950  Age: 72 y.o. MRN: [id number]  CC: No chief complaint on file.   HPI Drew Hansen presents for DM  Outpatient Medications Prior to Visit  Medication Sig Dispense Refill   aspirin 81 MG EC tablet Take 1 tablet (81 mg total) by mouth daily. Swallow whole. 30 tablet 12   b complex vitamins tablet Take 1 tablet by mouth daily. 100 tablet 3   cholecalciferol (VITAMIN D) 1000 units tablet Take 2 tablets (2,000 Units total) by mouth daily. 100 tablet 5   fluticasone (FLONASE) 50 MCG/ACT nasal spray Place 2 sprays into both nostrils daily. 16 g 11   furosemide (LASIX) 20 MG tablet TAKE 1 TO 2 TABLETS(20 TO 40 MG) BY MOUTH DAILY AS NEEDED FOR EDEMA 180 tablet 0   hydrochlorothiazide (MICROZIDE) 12.5 MG capsule TAKE 1 CAPSULE(12.5 MG) BY MOUTH DAILY 90 capsule 2   ibuprofen (ADVIL,MOTRIN) 600 MG tablet Take 600 mg by mouth 2 (two) times daily as needed.       loratadine (CLARITIN) 10 MG tablet Take 1 tablet (10 mg total) by mouth daily. 30 tablet 11   metFORMIN (GLUCOPHAGE) 500 MG tablet Take 1 tablet (500 mg total) by mouth 2 (two) times daily with a meal. 180 tablet 3   rosuvastatin (CRESTOR) 5 MG tablet Take 1 tablet (5 mg total) by mouth daily. 90 tablet 2   tadalafil (CIALIS) 5 MG tablet Take 1 tablet (5 mg total) by mouth daily. 30 tablet 11   tamsulosin (FLOMAX) 0.4 MG CAPS capsule Take 1 capsule (0.4 mg total) by mouth daily. 90 capsule 2   Semaglutide (RYBELSUS) 3 MG TABS Take 3 mg by mouth daily. 30 tablet 1   No facility-administered medications prior to visit.    ROS: Review of Systems  Constitutional:  Negative for appetite change, fatigue and unexpected weight change.  HENT:  Negative for congestion, nosebleeds, sneezing, sore throat and trouble swallowing.   Eyes:  Negative for itching and visual disturbance.  Respiratory:  Negative for cough.   Cardiovascular:  Negative for chest pain, palpitations and  leg swelling.  Gastrointestinal:  Negative for abdominal distention, blood in stool, diarrhea and nausea.  Genitourinary:  Negative for frequency and hematuria.  Musculoskeletal:  Negative for back pain, gait problem, joint swelling and neck pain.  Skin:  Negative for rash.  Neurological:  Negative for dizziness, tremors, speech difficulty and weakness.  Psychiatric/Behavioral:  Negative for agitation, dysphoric mood and sleep disturbance. The patient is not nervous/anxious.     Objective:  BP 118/78 (BP Location: Left Arm, Patient Position: Sitting, Cuff Size: Large)   Pulse 70   Temp 97.9 F (36.6 C) (Oral)   Ht 5\' 8"  (1.727 m)   Wt 262 lb (118.8 kg)   SpO2 97%   BMI 39.84 kg/m   BP Readings from Last 3 Encounters:  06/15/22 118/78  06/08/22 130/70  04/25/21 120/62    Wt Readings from Last 3 Encounters:  06/15/22 262 lb (118.8 kg)  06/08/22 262 lb (118.8 kg)  06/06/22 250 lb (113.4 kg)    Physical Exam Constitutional:      General: He is not in acute distress.    Appearance: He is well-developed. He is obese.     Comments: NAD  Eyes:     Conjunctiva/sclera: Conjunctivae normal.     Pupils: Pupils are equal, round, and reactive to light.  Neck:     Thyroid: No thyromegaly.  Vascular: No JVD.  Cardiovascular:     Rate and Rhythm: Normal rate and regular rhythm.     Heart sounds: Normal heart sounds. No murmur heard.    No friction rub. No gallop.  Pulmonary:     Effort: Pulmonary effort is normal. No respiratory distress.     Breath sounds: Normal breath sounds. No wheezing or rales.  Chest:     Chest wall: No tenderness.  Abdominal:     General: Bowel sounds are normal. There is no distension.     Palpations: Abdomen is soft. There is no mass.     Tenderness: There is no abdominal tenderness. There is no guarding or rebound.  Musculoskeletal:        General: No tenderness. Normal range of motion.     Cervical back: Normal range of motion.   Lymphadenopathy:     Cervical: No cervical adenopathy.  Skin:    General: Skin is warm and dry.     Findings: No rash.  Neurological:     Mental Status: He is alert and oriented to person, place, and time.     Cranial Nerves: No cranial nerve deficit.     Motor: No abnormal muscle tone.     Coordination: Coordination normal.     Gait: Gait normal.     Deep Tendon Reflexes: Reflexes are normal and symmetric.  Psychiatric:        Behavior: Behavior normal.        Thought Content: Thought content normal.        Judgment: Judgment normal.     Lab Results  Component Value Date   WBC 6.5 06/06/2022   HGB 15.2 06/06/2022   HCT 42.9 06/06/2022   PLT 174.0 06/06/2022   GLUCOSE 257 (H) 06/06/2022   CHOL 137 06/06/2022   TRIG 192.0 (H) 06/06/2022   HDL 35.30 (L) 06/06/2022   LDLDIRECT 102.0 06/20/2016   LDLCALC 64 06/06/2022   ALT 35 06/06/2022   AST 27 06/06/2022   NA 139 06/06/2022   K 4.3 06/06/2022   CL 101 06/06/2022   CREATININE 1.02 06/06/2022   BUN 13 06/06/2022   CO2 29 06/06/2022   TSH 3.80 06/06/2022   PSA 15.76 (H) 06/06/2022   HGBA1C 8.5 (H) 06/06/2022    MR PROSTATE W WO CONTRAST  Result Date: 07/27/2021 CLINICAL DATA:  Elevated PSA.  Atypical glands on prior biopsy. EXAM: MR PROSTATE WITHOUT AND WITH CONTRAST TECHNIQUE: Multiplanar multisequence MRI images were obtained of the pelvis centered about the prostate. Pre and post contrast images were obtained. CONTRAST:  25mL MULTIHANCE GADOBENATE DIMEGLUMINE 529 MG/ML IV SOLN COMPARISON:  None. FINDINGS: Prostate: -- Peripheral Zone: A 6 mm T2 hypointense nodule is seen in the right posteromedial mid gland, which has well-circumscribed margins this measures 6 mm on image 50/9. This nodule shows moderate ADC hypointensity, minimal DWI hyperintensity, and early focal contrast enhancement. PI-RADS 4 -- Transition/Central Zone: Moderate to markedly enlarged with involvement by BPH nodules. No nodules with suspicious  characteristics on T2-weighted or diffusion imaging. -- Measurements/Volume:  6.3 by 6.0 x 7.1 cm (volume = 140 cm^3) Transcapsular spread:  Absent Seminal vesicle involvement:  Absent Neurovascular bundle involvement:  Absent Pelvic adenopathy: 9 mm right pelvic sidewall lymph node seen just below the iliac bifurcation. No pathologically enlarged lymph nodes identified. Bone metastasis: None visualized Other: Diffuse bladder wall thickening with multiple small diverticula, consistent with chronic bladder outlet obstruction. IMPRESSION: 6 mm peripheral zone nodule in the right posteromedial mid  gland, suspicious for high-grade carcinoma. PI-RADS 4 (v2.1): High (clinically significant cancer likely) Nonspecific 9 mm right pelvic sidewall lymph node just below the iliac bifurcation. No pathologically enlarged lymph nodes identified. (I have post-processed this exam in the DynaCAD application for potential fusion-guided biopsy.) Electronically Signed   By: Marlaine Hind M.D.   On: 07/27/2021 15:19    Assessment & Plan:   Problem List Items Addressed This Visit       Endocrine   Diabetes mellitus type 2 in obese ([place]) - Primary    Will increase Rybelsus after 4 wks on 3 mg/d RTC 3 months      Relevant Medications   Semaglutide (RYBELSUS) 7 MG TABS      Meds ordered this encounter  Medications   Semaglutide (RYBELSUS) 7 MG TABS    Sig: Take 1 tablet (7 mg total) by mouth daily. Take 30 min ac    Dispense:  30 tablet    Refill:  3      Follow-up: Return in about 3 months (around 09/14/2022) for a follow-up visit.  Walker Kehr, MD

## 2022-06-22 DIAGNOSIS — R972 Elevated prostate specific antigen [PSA]: Secondary | ICD-10-CM | POA: Diagnosis not present

## 2022-06-29 DIAGNOSIS — N401 Enlarged prostate with lower urinary tract symptoms: Secondary | ICD-10-CM | POA: Diagnosis not present

## 2022-06-29 DIAGNOSIS — R3912 Poor urinary stream: Secondary | ICD-10-CM | POA: Diagnosis not present

## 2022-06-29 DIAGNOSIS — R972 Elevated prostate specific antigen [PSA]: Secondary | ICD-10-CM | POA: Diagnosis not present

## 2022-06-29 DIAGNOSIS — N210 Calculus in bladder: Secondary | ICD-10-CM | POA: Diagnosis not present

## 2022-06-29 DIAGNOSIS — R351 Nocturia: Secondary | ICD-10-CM | POA: Diagnosis not present

## 2022-06-29 DIAGNOSIS — R3914 Feeling of incomplete bladder emptying: Secondary | ICD-10-CM | POA: Diagnosis not present

## 2022-07-17 ENCOUNTER — Encounter: Payer: Self-pay | Admitting: Internal Medicine

## 2022-07-18 ENCOUNTER — Other Ambulatory Visit: Payer: Self-pay | Admitting: Internal Medicine

## 2022-07-18 MED ORDER — RYBELSUS 14 MG PO TABS: 14.0000 mg | ORAL_TABLET | Freq: Every day | ORAL | 5 refills | Status: DC

## 2022-08-25 NOTE — Telephone Encounter (Signed)
FYI.. insurance states they cover both ozempic and mounjaro

## 2022-09-04 ENCOUNTER — Other Ambulatory Visit: Payer: Self-pay | Admitting: *Deleted

## 2022-09-04 MED ORDER — RYBELSUS 14 MG PO TABS: 14.0000 mg | ORAL_TABLET | Freq: Every day | ORAL | 5 refills | Status: DC

## 2022-09-07 ENCOUNTER — Ambulatory Visit: Payer: Medicare PPO | Admitting: Internal Medicine

## 2022-09-08 ENCOUNTER — Other Ambulatory Visit: Payer: Self-pay | Admitting: Internal Medicine

## 2022-09-12 ENCOUNTER — Encounter: Payer: Self-pay | Admitting: Internal Medicine

## 2022-09-12 ENCOUNTER — Ambulatory Visit (INDEPENDENT_AMBULATORY_CARE_PROVIDER_SITE_OTHER): Payer: Medicare PPO | Admitting: Internal Medicine

## 2022-09-12 VITALS — BP 125/80 | HR 62 | Temp 97.7°F | Ht 68.0 in | Wt 256.0 lb

## 2022-09-12 DIAGNOSIS — E559 Vitamin D deficiency, unspecified: Secondary | ICD-10-CM | POA: Diagnosis not present

## 2022-09-12 DIAGNOSIS — I10 Essential (primary) hypertension: Secondary | ICD-10-CM

## 2022-09-12 DIAGNOSIS — E669 Obesity, unspecified: Secondary | ICD-10-CM

## 2022-09-12 DIAGNOSIS — E1169 Type 2 diabetes mellitus with other specified complication: Secondary | ICD-10-CM

## 2022-09-12 DIAGNOSIS — I2583 Coronary atherosclerosis due to lipid rich plaque: Secondary | ICD-10-CM

## 2022-09-12 DIAGNOSIS — I2510 Atherosclerotic heart disease of native coronary artery without angina pectoris: Secondary | ICD-10-CM | POA: Diagnosis not present

## 2022-09-12 LAB — COMPREHENSIVE METABOLIC PANEL
ALT: 28 U/L (ref 0–53)
AST: 30 U/L (ref 0–37)
Albumin: 4.4 g/dL (ref 3.5–5.2)
Alkaline Phosphatase: 49 U/L (ref 39–117)
BUN: 13 mg/dL (ref 6–23)
CO2: 28 mEq/L (ref 19–32)
Calcium: 9.3 mg/dL (ref 8.4–10.5)
Chloride: 102 mEq/L (ref 96–112)
Creatinine, Ser: 0.91 mg/dL (ref 0.40–1.50)
GFR: 84.33 mL/min (ref >60.00)
Glucose, Bld: 140 mg/dL — ABNORMAL HIGH (ref 70–99)
Potassium: 4.3 mEq/L (ref 3.5–5.1)
Sodium: 136 mEq/L (ref 135–145)
Total Bilirubin: 0.6 mg/dL (ref 0.2–1.2)
Total Protein: 7.2 g/dL (ref 6.0–8.3)

## 2022-09-12 LAB — HEMOGLOBIN A1C: Hgb A1c MFr Bld: 7 % — ABNORMAL HIGH (ref 4.6–6.5)

## 2022-09-12 MED ORDER — ONETOUCH ULTRASOFT LANCETS MISC: 12 refills | Status: AC

## 2022-09-12 MED ORDER — ONETOUCH VERIO W/DEVICE KIT
1.0000 [IU] | PACK | Freq: Every day | 1 refills | Status: DC | PRN
Start: 2022-09-12 — End: 2022-09-12

## 2022-09-12 MED ORDER — ONETOUCH VERIO W/DEVICE KIT: 1.0000 [IU] | PACK | Freq: Every day | 1 refills | Status: AC | PRN

## 2022-09-12 MED ORDER — ONETOUCH VERIO VI STRP
ORAL_STRIP | 11 refills | Status: DC
Start: 2022-09-12 — End: 2024-02-13

## 2022-09-12 NOTE — Assessment & Plan Note (Signed)
Continue Rybelsus after 14 mg/d

## 2022-09-12 NOTE — Assessment & Plan Note (Signed)
Cont on HCTZ 

## 2022-09-12 NOTE — Progress Notes (Signed)
Subjective:  Patient ID: Drew Hansen, male    DOB: January 26, 1951  Age: 72 y.o. MRN: [id number]  CC: No chief complaint on file.   HPI Drew Hansen presents for L flank pain F/u DM, HTN  Outpatient Medications Prior to Visit  Medication Sig Dispense Refill   aspirin 81 MG EC tablet Take 1 tablet (81 mg total) by mouth daily. Swallow whole. 30 tablet 12   b complex vitamins tablet Take 1 tablet by mouth daily. 100 tablet 3   cholecalciferol (VITAMIN D) 1000 units tablet Take 2 tablets (2,000 Units total) by mouth daily. 100 tablet 5   fluticasone (FLONASE) 50 MCG/ACT nasal spray Place 2 sprays into both nostrils daily. 16 g 11   furosemide (LASIX) 20 MG tablet TAKE 1 TO 2 TABLETS(20 TO 40 MG) BY MOUTH DAILY AS NEEDED FOR EDEMA 180 tablet 0   hydrochlorothiazide (MICROZIDE) 12.5 MG capsule TAKE 1 CAPSULE(12.5 MG) BY MOUTH DAILY 90 capsule 2   ibuprofen (ADVIL,MOTRIN) 600 MG tablet Take 600 mg by mouth 2 (two) times daily as needed.       loratadine (CLARITIN) 10 MG tablet Take 1 tablet (10 mg total) by mouth daily. 30 tablet 11   metFORMIN (GLUCOPHAGE) 500 MG tablet Take 1 tablet (500 mg total) by mouth 2 (two) times daily with a meal. 180 tablet 3   rosuvastatin (CRESTOR) 5 MG tablet Take 1 tablet (5 mg total) by mouth daily. 90 tablet 2   Semaglutide (RYBELSUS) 14 MG TABS Take 1 tablet (14 mg total) by mouth daily. 30 tablet 5   tadalafil (CIALIS) 5 MG tablet Take 1 tablet (5 mg total) by mouth daily. 30 tablet 11   tamsulosin (FLOMAX) 0.4 MG CAPS capsule Take 1 capsule (0.4 mg total) by mouth daily. 90 capsule 2   No facility-administered medications prior to visit.    ROS: Review of Systems  Constitutional:  Negative for appetite change, fatigue and unexpected weight change.  HENT:  Negative for congestion, nosebleeds, sneezing, sore throat and trouble swallowing.   Eyes:  Negative for itching and visual disturbance.  Respiratory:  Negative for cough.   Cardiovascular:   Negative for chest pain, palpitations and leg swelling.  Gastrointestinal:  Negative for abdominal distention, blood in stool, diarrhea and nausea.  Genitourinary:  Negative for frequency and hematuria.  Musculoskeletal:  Positive for back pain. Negative for gait problem, joint swelling and neck pain.  Skin:  Negative for rash.  Neurological:  Negative for dizziness, tremors, speech difficulty and weakness.  Psychiatric/Behavioral:  Negative for agitation, dysphoric mood and sleep disturbance. The patient is not nervous/anxious.     Objective:  BP 125/80   Pulse 62   Temp 97.7 F (36.5 C) (Oral)   Ht 5\' 8"  (1.727 m)   Wt 256 lb (116.1 kg)   BMI 38.92 kg/m   BP Readings from Last 3 Encounters:  09/12/22 125/80  06/15/22 118/78  06/08/22 130/70    Wt Readings from Last 3 Encounters:  09/12/22 256 lb (116.1 kg)  06/15/22 262 lb (118.8 kg)  06/08/22 262 lb (118.8 kg)    Physical Exam Constitutional:      General: He is not in acute distress.    Appearance: He is well-developed. He is obese.     Comments: NAD  Eyes:     Conjunctiva/sclera: Conjunctivae normal.     Pupils: Pupils are equal, round, and reactive to light.  Neck:     Thyroid: No thyromegaly.  Vascular: No JVD.  Cardiovascular:     Rate and Rhythm: Normal rate and regular rhythm.     Heart sounds: Normal heart sounds. No murmur heard.    No friction rub. No gallop.  Pulmonary:     Effort: Pulmonary effort is normal. No respiratory distress.     Breath sounds: Normal breath sounds. No wheezing or rales.  Chest:     Chest wall: No tenderness.  Abdominal:     General: Bowel sounds are normal. There is no distension.     Palpations: Abdomen is soft. There is no mass.     Tenderness: There is no abdominal tenderness. There is no guarding or rebound.  Musculoskeletal:        General: No tenderness. Normal range of motion.     Cervical back: Normal range of motion.  Lymphadenopathy:     Cervical: No  cervical adenopathy.  Skin:    General: Skin is warm and dry.     Findings: No rash.  Neurological:     Mental Status: He is alert and oriented to person, place, and time.     Cranial Nerves: No cranial nerve deficit.     Motor: No abnormal muscle tone.     Coordination: Coordination normal.     Gait: Gait normal.     Deep Tendon Reflexes: Reflexes are normal and symmetric.  Psychiatric:        Behavior: Behavior normal.        Thought Content: Thought content normal.        Judgment: Judgment normal.     Lab Results  Component Value Date   WBC 6.5 06/06/2022   HGB 15.2 06/06/2022   HCT 42.9 06/06/2022   PLT 174.0 06/06/2022   GLUCOSE 140 (H) 09/12/2022   CHOL 137 06/06/2022   TRIG 192.0 (H) 06/06/2022   HDL 35.30 (L) 06/06/2022   LDLDIRECT 102.0 06/20/2016   LDLCALC 64 06/06/2022   ALT 28 09/12/2022   AST 30 09/12/2022   NA 136 09/12/2022   K 4.3 09/12/2022   CL 102 09/12/2022   CREATININE 0.91 09/12/2022   BUN 13 09/12/2022   CO2 28 09/12/2022   TSH 3.80 06/06/2022   PSA 15.76 (H) 06/06/2022   HGBA1C 7.0 (H) 09/12/2022    MR PROSTATE W WO CONTRAST  Result Date: 07/27/2021 CLINICAL DATA:  Elevated PSA.  Atypical glands on prior biopsy. EXAM: MR PROSTATE WITHOUT AND WITH CONTRAST TECHNIQUE: Multiplanar multisequence MRI images were obtained of the pelvis centered about the prostate. Pre and post contrast images were obtained. CONTRAST:  20mL MULTIHANCE GADOBENATE DIMEGLUMINE 529 MG/ML IV SOLN COMPARISON:  None. FINDINGS: Prostate: -- Peripheral Zone: A 6 mm T2 hypointense nodule is seen in the right posteromedial mid gland, which has well-circumscribed margins this measures 6 mm on image 50/9. This nodule shows moderate ADC hypointensity, minimal DWI hyperintensity, and early focal contrast enhancement. PI-RADS 4 -- Transition/Central Zone: Moderate to markedly enlarged with involvement by BPH nodules. No nodules with suspicious characteristics on T2-weighted or diffusion  imaging. -- Measurements/Volume:  6.3 by 6.0 x 7.1 cm (volume = 140 cm^3) Transcapsular spread:  Absent Seminal vesicle involvement:  Absent Neurovascular bundle involvement:  Absent Pelvic adenopathy: 9 mm right pelvic sidewall lymph node seen just below the iliac bifurcation. No pathologically enlarged lymph nodes identified. Bone metastasis: None visualized Other: Diffuse bladder wall thickening with multiple small diverticula, consistent with chronic bladder outlet obstruction. IMPRESSION: 6 mm peripheral zone nodule in the right posteromedial mid  gland, suspicious for high-grade carcinoma. PI-RADS 4 (v2.1): High (clinically significant cancer likely) Nonspecific 9 mm right pelvic sidewall lymph node just below the iliac bifurcation. No pathologically enlarged lymph nodes identified. (I have post-processed this exam in the DynaCAD application for potential fusion-guided biopsy.) Electronically Signed   By: Danae Orleans M.D.   On: 07/27/2021 15:19    Assessment & Plan:   Problem List Items Addressed This Visit     Vitamin D deficiency    On Vit D      HTN (hypertension) - Primary    Cont on HCTZ      Type 2 diabetes mellitus with obesity ([facility])    Continue Rybelsus after 14 mg/d      CAD (coronary artery disease)    On Rybelsus, Crestor, ASA         Meds ordered this encounter  Medications   DISCONTD: Blood Glucose Monitoring Suppl (ONETOUCH VERIO) w/Device KIT    Sig: 1 Units by Does not apply route daily as needed.    Dispense:  1 kit    Refill:  1   glucose blood (ONETOUCH VERIO) test strip    Sig: Use as instructed    Dispense:  50 each    Refill:  11   Lancets (ONETOUCH ULTRASOFT) lancets    Sig: Use as instructed    Dispense:  100 each    Refill:  12   Blood Glucose Monitoring Suppl (ONETOUCH VERIO) w/Device KIT    Sig: 1 Units by Does not apply route daily as needed.    Dispense:  1 kit    Refill:  1      Follow-up: Return in about 3 months (around 12/12/2022)  for a follow-up visit.  Sonda Primes, MD

## 2022-09-12 NOTE — Assessment & Plan Note (Signed)
On Rybelsus, Crestor, ASA

## 2022-09-12 NOTE — Patient Instructions (Signed)
Blue-Emu cream -- use 2-3 times a day ? ?

## 2022-09-12 NOTE — Assessment & Plan Note (Signed)
On Vit D 

## 2022-10-07 ENCOUNTER — Encounter: Payer: Self-pay | Admitting: Internal Medicine

## 2022-10-08 ENCOUNTER — Encounter: Payer: Self-pay | Admitting: Internal Medicine

## 2022-10-09 MED ORDER — ROSUVASTATIN CALCIUM 5 MG PO TABS: 5.0000 mg | ORAL_TABLET | Freq: Every day | ORAL | 2 refills | Status: DC

## 2022-10-19 ENCOUNTER — Other Ambulatory Visit: Payer: Self-pay | Admitting: *Deleted

## 2022-10-19 MED ORDER — TAMSULOSIN HCL 0.4 MG PO CAPS: 0.4000 mg | ORAL_CAPSULE | Freq: Every day | ORAL | 2 refills | Status: DC

## 2022-10-24 ENCOUNTER — Encounter: Payer: Self-pay | Admitting: Internal Medicine

## 2022-10-24 MED ORDER — RYBELSUS 14 MG PO TABS: 14.0000 mg | ORAL_TABLET | Freq: Every day | ORAL | 1 refills | Status: DC

## 2022-10-24 MED ORDER — TADALAFIL 5 MG PO TABS: 5.0000 mg | ORAL_TABLET | Freq: Every day | ORAL | 1 refills | Status: DC

## 2022-10-30 NOTE — Telephone Encounter (Signed)
Noted../lmb 

## 2023-02-14 ENCOUNTER — Encounter: Payer: Self-pay | Admitting: Internal Medicine

## 2023-02-15 ENCOUNTER — Other Ambulatory Visit: Payer: Self-pay | Admitting: Internal Medicine

## 2023-02-15 DIAGNOSIS — E1169 Type 2 diabetes mellitus with other specified complication: Secondary | ICD-10-CM

## 2023-02-16 ENCOUNTER — Other Ambulatory Visit (INDEPENDENT_AMBULATORY_CARE_PROVIDER_SITE_OTHER): Payer: Medicare PPO

## 2023-02-16 DIAGNOSIS — E669 Obesity, unspecified: Secondary | ICD-10-CM | POA: Diagnosis not present

## 2023-02-16 DIAGNOSIS — E1169 Type 2 diabetes mellitus with other specified complication: Secondary | ICD-10-CM

## 2023-02-16 LAB — COMPREHENSIVE METABOLIC PANEL
ALT: 15 U/L (ref 0–53)
AST: 14 U/L (ref 0–37)
Albumin: 4.3 g/dL (ref 3.5–5.2)
Alkaline Phosphatase: 51 U/L (ref 39–117)
BUN: 13 mg/dL (ref 6–23)
CO2: 30 meq/L (ref 19–32)
Calcium: 9.4 mg/dL (ref 8.4–10.5)
Chloride: 103 meq/L (ref 96–112)
Creatinine, Ser: 0.94 mg/dL (ref 0.40–1.50)
GFR: 80.87 mL/min (ref >60.00)
Glucose, Bld: 158 mg/dL — ABNORMAL HIGH (ref 70–99)
Potassium: 3.9 meq/L (ref 3.5–5.1)
Sodium: 140 meq/L (ref 135–145)
Total Bilirubin: 0.5 mg/dL (ref 0.2–1.2)
Total Protein: 6.9 g/dL (ref 6.0–8.3)

## 2023-02-16 LAB — HEMOGLOBIN A1C: Hgb A1c MFr Bld: 6.1 % (ref 4.6–6.5)

## 2023-02-19 ENCOUNTER — Encounter: Payer: Self-pay | Admitting: Internal Medicine

## 2023-02-19 ENCOUNTER — Ambulatory Visit: Payer: Medicare PPO | Admitting: Internal Medicine

## 2023-02-19 VITALS — BP 126/80 | HR 62 | Temp 97.9°F | Ht 68.0 in | Wt 247.2 lb

## 2023-02-19 DIAGNOSIS — I2583 Coronary atherosclerosis due to lipid rich plaque: Secondary | ICD-10-CM | POA: Diagnosis not present

## 2023-02-19 DIAGNOSIS — Z7984 Long term (current) use of oral hypoglycemic drugs: Secondary | ICD-10-CM | POA: Diagnosis not present

## 2023-02-19 DIAGNOSIS — R600 Localized edema: Secondary | ICD-10-CM | POA: Diagnosis not present

## 2023-02-19 DIAGNOSIS — E559 Vitamin D deficiency, unspecified: Secondary | ICD-10-CM | POA: Diagnosis not present

## 2023-02-19 DIAGNOSIS — I10 Essential (primary) hypertension: Secondary | ICD-10-CM | POA: Diagnosis not present

## 2023-02-19 DIAGNOSIS — I2510 Atherosclerotic heart disease of native coronary artery without angina pectoris: Secondary | ICD-10-CM

## 2023-02-19 DIAGNOSIS — E669 Obesity, unspecified: Secondary | ICD-10-CM

## 2023-02-19 DIAGNOSIS — G629 Polyneuropathy, unspecified: Secondary | ICD-10-CM

## 2023-02-19 DIAGNOSIS — E1169 Type 2 diabetes mellitus with other specified complication: Secondary | ICD-10-CM | POA: Diagnosis not present

## 2023-02-19 NOTE — Assessment & Plan Note (Addendum)
Recurrent post-travel Use compression socks Lasix prn

## 2023-02-19 NOTE — Progress Notes (Addendum)
Subjective:  Patient ID: Drew Hansen, male    DOB: 1950-05-26  Age: 72 y.o. MRN: [id number]  CC: Medical Management of Chronic Issues (Diabetes ) and Foot Exam    HPI Drew Hansen presents for DM, HTN C/o B edema - returned from [place] 10 d ago C/o tingling  Outpatient Medications Prior to Visit  Medication Sig Dispense Refill   aspirin 81 MG EC tablet Take 1 tablet (81 mg total) by mouth daily. Swallow whole. 30 tablet 12   b complex vitamins tablet Take 1 tablet by mouth daily. 100 tablet 3   Blood Glucose Monitoring Suppl (ONETOUCH VERIO) w/Device KIT 1 Units by Does not apply route daily as needed. 1 kit 1   cholecalciferol (VITAMIN D) 1000 units tablet Take 2 tablets (2,000 Units total) by mouth daily. 100 tablet 5   fluticasone (FLONASE) 50 MCG/ACT nasal spray Place 2 sprays into both nostrils daily. 16 g 11   furosemide (LASIX) 20 MG tablet TAKE 1 TO 2 TABLETS(20 TO 40 MG) BY MOUTH DAILY AS NEEDED FOR EDEMA 180 tablet 0   glucose blood (ONETOUCH VERIO) test strip Use as instructed 50 each 11   hydrochlorothiazide (MICROZIDE) 12.5 MG capsule TAKE 1 CAPSULE(12.5 MG) BY MOUTH DAILY 90 capsule 2   ibuprofen (ADVIL,MOTRIN) 600 MG tablet Take 600 mg by mouth 2 (two) times daily as needed.       Lancets (ONETOUCH ULTRASOFT) lancets Use as instructed 100 each 12   loratadine (CLARITIN) 10 MG tablet Take 1 tablet (10 mg total) by mouth daily. 30 tablet 11   metFORMIN (GLUCOPHAGE) 500 MG tablet Take 1 tablet (500 mg total) by mouth 2 (two) times daily with a meal. 180 tablet 3   rosuvastatin (CRESTOR) 5 MG tablet Take 1 tablet (5 mg total) by mouth daily. 90 tablet 2   Semaglutide (RYBELSUS) 14 MG TABS Take 1 tablet (14 mg total) by mouth daily. 90 tablet 1   tadalafil (CIALIS) 5 MG tablet Take 1 tablet (5 mg total) by mouth daily. 90 tablet 1   tamsulosin (FLOMAX) 0.4 MG CAPS capsule Take 1 capsule (0.4 mg total) by mouth daily. 90 capsule 2   No facility-administered  medications prior to visit.    ROS: Review of Systems  Constitutional:  Negative for appetite change, fatigue and unexpected weight change.  HENT:  Negative for congestion, nosebleeds, sneezing, sore throat and trouble swallowing.   Eyes:  Negative for itching and visual disturbance.  Respiratory:  Negative for cough.   Cardiovascular:  Positive for leg swelling. Negative for chest pain and palpitations.  Gastrointestinal:  Negative for abdominal distention, blood in stool, diarrhea and nausea.  Genitourinary:  Negative for frequency and hematuria.  Musculoskeletal:  Negative for back pain, gait problem, joint swelling and neck pain.  Skin:  Negative for rash.  Neurological:  Positive for numbness. Negative for dizziness, tremors, speech difficulty and weakness.  Psychiatric/Behavioral:  Negative for agitation, dysphoric mood and sleep disturbance. The patient is not nervous/anxious.     Objective:  BP 126/80 (BP Location: Left Arm, Patient Position: Sitting, Cuff Size: Normal)   Pulse 62   Temp 97.9 F (36.6 C) (Oral)   Ht 5\' 8"  (1.727 m)   Wt 247 lb 3.2 oz (112.1 kg)   SpO2 94%   BMI 37.59 kg/m   BP Readings from Last 3 Encounters:  02/19/23 126/80  09/12/22 125/80  06/15/22 118/78    Wt Readings from Last 3 Encounters:  02/19/23 247 lb 3.2  oz (112.1 kg)  09/12/22 256 lb (116.1 kg)  06/15/22 262 lb (118.8 kg)    Physical Exam Constitutional:      General: He is not in acute distress.    Appearance: He is well-developed. He is obese.     Comments: NAD  Eyes:     Conjunctiva/sclera: Conjunctivae normal.     Pupils: Pupils are equal, round, and reactive to light.  Neck:     Thyroid: No thyromegaly.     Vascular: No JVD.  Cardiovascular:     Rate and Rhythm: Normal rate and regular rhythm.     Heart sounds: Normal heart sounds. No murmur heard.    No friction rub. No gallop.  Pulmonary:     Effort: Pulmonary effort is normal. No respiratory distress.     Breath  sounds: Normal breath sounds. No wheezing or rales.  Chest:     Chest wall: No tenderness.  Abdominal:     General: Bowel sounds are normal. There is no distension.     Palpations: Abdomen is soft. There is no mass.     Tenderness: There is no abdominal tenderness. There is no guarding or rebound.  Musculoskeletal:        General: No tenderness. Normal range of motion.     Cervical back: Normal range of motion.     Right lower leg: Edema present.     Left lower leg: Edema present.  Lymphadenopathy:     Cervical: No cervical adenopathy.  Skin:    General: Skin is warm and dry.     Findings: No rash.  Neurological:     Mental Status: He is alert and oriented to person, place, and time.     Cranial Nerves: No cranial nerve deficit.     Motor: No abnormal muscle tone.     Coordination: Coordination normal.     Gait: Gait normal.     Deep Tendon Reflexes: Reflexes are normal and symmetric.  Psychiatric:        Behavior: Behavior normal.        Thought Content: Thought content normal.        Judgment: Judgment normal.   Trace edema B  Lab Results  Component Value Date   WBC 6.5 06/06/2022   HGB 15.2 06/06/2022   HCT 42.9 06/06/2022   PLT 174.0 06/06/2022   GLUCOSE 158 (H) 02/16/2023   CHOL 137 06/06/2022   TRIG 192.0 (H) 06/06/2022   HDL 35.30 (L) 06/06/2022   LDLDIRECT 102.0 06/20/2016   LDLCALC 64 06/06/2022   ALT 15 02/16/2023   AST 14 02/16/2023   NA 140 02/16/2023   K 3.9 02/16/2023   CL 103 02/16/2023   CREATININE 0.94 02/16/2023   BUN 13 02/16/2023   CO2 30 02/16/2023   TSH 3.80 06/06/2022   PSA 15.76 (H) 06/06/2022   HGBA1C 6.1 02/16/2023    MR PROSTATE W WO CONTRAST  Result Date: 07/27/2021 CLINICAL DATA:  Elevated PSA.  Atypical glands on prior biopsy. EXAM: MR PROSTATE WITHOUT AND WITH CONTRAST TECHNIQUE: Multiplanar multisequence MRI images were obtained of the pelvis centered about the prostate. Pre and post contrast images were obtained. CONTRAST:   20mL MULTIHANCE GADOBENATE DIMEGLUMINE 529 MG/ML IV SOLN COMPARISON:  None. FINDINGS: Prostate: -- Peripheral Zone: A 6 mm T2 hypointense nodule is seen in the right posteromedial mid gland, which has well-circumscribed margins this measures 6 mm on image 50/9. This nodule shows moderate ADC hypointensity, minimal DWI hyperintensity, and early focal contrast  enhancement. PI-RADS 4 -- Transition/Central Zone: Moderate to markedly enlarged with involvement by BPH nodules. No nodules with suspicious characteristics on T2-weighted or diffusion imaging. -- Measurements/Volume:  6.3 by 6.0 x 7.1 cm (volume = 140 cm^3) Transcapsular spread:  Absent Seminal vesicle involvement:  Absent Neurovascular bundle involvement:  Absent Pelvic adenopathy: 9 mm right pelvic sidewall lymph node seen just below the iliac bifurcation. No pathologically enlarged lymph nodes identified. Bone metastasis: None visualized Other: Diffuse bladder wall thickening with multiple small diverticula, consistent with chronic bladder outlet obstruction. IMPRESSION: 6 mm peripheral zone nodule in the right posteromedial mid gland, suspicious for high-grade carcinoma. PI-RADS 4 (v2.1): High (clinically significant cancer likely) Nonspecific 9 mm right pelvic sidewall lymph node just below the iliac bifurcation. No pathologically enlarged lymph nodes identified. (I have post-processed this exam in the DynaCAD application for potential fusion-guided biopsy.) Electronically Signed   By: Danae Orleans M.D.   On: 07/27/2021 15:19    Assessment & Plan:   Problem List Items Addressed This Visit     Vitamin D deficiency    On Vit D      HTN (hypertension)    Cont on HCTZ      Relevant Orders   Comprehensive metabolic panel   Hemoglobin A1c   CBC with Differential/Platelet   PSA   TSH   Urinalysis   Type 2 diabetes mellitus with obesity ([facility]) - Primary    Continue Rybelsus after 14 mg/d  Metformin to bid      Relevant Orders    Comprehensive metabolic panel   Hemoglobin A1c   CBC with Differential/Platelet   PSA   TSH   Urinalysis   Neuropathy    Treat edema Furosemide daily prn      Relevant Orders   Comprehensive metabolic panel   Hemoglobin A1c   CBC with Differential/Platelet   PSA   TSH   Urinalysis   CAD (coronary artery disease)    On Rybelsus, Crestor, ASA      Relevant Orders   Comprehensive metabolic panel   Hemoglobin A1c   CBC with Differential/Platelet   PSA   TSH   Urinalysis   Edema    Recurrent post-travel Use compression socks Lasix prn         No orders of the defined types were placed in this encounter.     Follow-up: Return in about 3 months (around 05/21/2023) for a follow-up visit.  Sonda Primes, MD

## 2023-02-19 NOTE — Assessment & Plan Note (Signed)
Cont on HCTZ 

## 2023-02-19 NOTE — Addendum Note (Signed)
Addended by: Tresa Garter on: 02/19/2023 01:54 PM   Modules accepted: Level of Service

## 2023-02-19 NOTE — Assessment & Plan Note (Signed)
On Vit D 

## 2023-02-19 NOTE — Assessment & Plan Note (Signed)
Treat edema Furosemide daily prn

## 2023-02-19 NOTE — Assessment & Plan Note (Addendum)
Continue Rybelsus after 14 mg/d  Metformin to bid

## 2023-02-19 NOTE — Assessment & Plan Note (Signed)
On Rybelsus, Crestor, ASA

## 2023-02-20 ENCOUNTER — Ambulatory Visit: Payer: Medicare PPO | Admitting: Internal Medicine

## 2023-03-14 ENCOUNTER — Ambulatory Visit (INDEPENDENT_AMBULATORY_CARE_PROVIDER_SITE_OTHER): Payer: Medicare PPO

## 2023-03-14 ENCOUNTER — Ambulatory Visit: Payer: Medicare PPO | Admitting: Family Medicine

## 2023-03-14 ENCOUNTER — Other Ambulatory Visit: Payer: Self-pay | Admitting: Internal Medicine

## 2023-03-14 VITALS — BP 172/98 | HR 68 | Ht 68.0 in | Wt 253.0 lb

## 2023-03-14 DIAGNOSIS — M47816 Spondylosis without myelopathy or radiculopathy, lumbar region: Secondary | ICD-10-CM | POA: Diagnosis not present

## 2023-03-14 DIAGNOSIS — M5430 Sciatica, unspecified side: Secondary | ICD-10-CM

## 2023-03-14 DIAGNOSIS — M48061 Spinal stenosis, lumbar region without neurogenic claudication: Secondary | ICD-10-CM | POA: Diagnosis not present

## 2023-03-14 DIAGNOSIS — M545 Low back pain, unspecified: Secondary | ICD-10-CM

## 2023-03-14 DIAGNOSIS — I700 Atherosclerosis of aorta: Secondary | ICD-10-CM | POA: Diagnosis not present

## 2023-03-14 MED ORDER — TIZANIDINE HCL 2 MG PO TABS: 2.0000 mg | ORAL_TABLET | Freq: Three times a day (TID) | ORAL | 1 refills | Status: AC | PRN

## 2023-03-14 NOTE — Patient Instructions (Addendum)
Thank you for coming in today.   Please get an Xray today before you leave   I've referred you to Physical Therapy.  Let us know if you don't hear from them in one week.   Try using a heating pad  Check back in 6 weeks

## 2023-03-14 NOTE — Progress Notes (Signed)
   [name], PhD, LAT, ATC acting as a scribe for [name], MD.  Drew Hansen is a 72 y.o. male who presents to [facility] at [facility] today for LBP ongoing for over a wk. Injury to his low back 25 years ago. Pt locates pain to L-side of his low back. He had one episode of shooting pain into his L leg.    Radiating pain: no LE numbness/tingling: no LE weakness: no Aggravates: activity Treatments tried: IBU  Dx imaging: 11/07/13 L-spine XR  Pertinent review of systems: No fevers or chills  Relevant historical information: Diabetes   Exam:  BP (!) 172/98   Pulse 68   Ht 5\' 8"  (1.727 m)   Wt 253 lb (114.8 kg)   SpO2 96%   BMI 38.47 kg/m  General: Well Developed, well nourished, and in no acute distress.   MSK: L-spine: Normal appearing. Nontender palpation spinal midline. Tender palpation left lumbar paraspinal musculature. Range of motion Limited rotation lateral flexion and flexion.  Intact extension. Lower extremity strength is intact. Reflexes are intact.    Lab and Radiology Results  X-ray images lumbar spine obtained today personally and independently interpreted Multilevel DDD and facet DJD.  No acute fractures are visible. Await formal radiology review     Assessment and Plan: 72 y.o. male with acute exacerbation of chronic left low back pain.  He has had pain off and on for years but had acute worsening pain about 2 weeks ago.  Pain thought to be related to muscle spasm and dysfunction.  He may have some radicular component but that seems to be less dominant.  Plan for course of physical therapy.  Will use tizanidine as needed at bedtime along with heating pad. Recheck in 6 weeks.  PDMP not reviewed this encounter. Orders Placed This Encounter  Procedures   DG Lumbar Spine 2-3 Views    Standing Status:   Future    Number of Occurrences:   1    Standing Expiration Date:   03/13/2024    Order Specific Question:    Reason for Exam (SYMPTOM  OR DIAGNOSIS REQUIRED)    Answer:   eval low back pain    Order Specific Question:   Preferred imaging location?    Answer:   [id number]   Ambulatory referral to Physical Therapy    Referral Priority:   Routine    Referral Type:   Physical Medicine    Referral Reason:   Specialty Services Required    Requested Specialty:   Physical Therapy    Number of Visits Requested:   1   Meds ordered this encounter  Medications   tiZANidine (ZANAFLEX) 2 MG tablet    Sig: Take 1-2 tablets (2-4 mg total) by mouth every 8 (eight) hours as needed.    Dispense:  60 tablet    Refill:  1     Discussed warning signs or symptoms. Please see discharge instructions. Patient expresses understanding.   The above documentation has been reviewed and is accurate and complete [name], M.D.

## 2023-03-15 NOTE — Therapy (Signed)
OUTPATIENT PHYSICAL THERAPY THORACOLUMBAR EVALUATION   Patient Name: Drew Hansen MRN: [id number] DOB:1950/08/10, 72 y.o., male Today's Date: 03/19/2023  END OF SESSION:  PT End of Session - 03/19/23 1100     Visit Number 1    Number of Visits 16    Date for PT Re-Evaluation 06/11/23    Authorization Type humana- auth required    PT Start Time 1101    PT Stop Time 1150    PT Time Calculation (min) 49 min    Activity Tolerance Patient limited by pain    Behavior During Therapy [facility] for tasks assessed/performed             Past Medical History:  Diagnosis Date   BPH (benign prostatic hyperplasia) 2007   Elevated PSA, s/p bx Dr Vernie Ammons   Past Surgical History:  Procedure Laterality Date   PROSTATE BIOPSY     Dr Vernie Ammons - he did not go?   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/21/2021   Tinnitus of both ears 07/21/2021   Hearing loss 04/25/2021   Edema 04/25/2021   CAD (coronary artery disease) 12/22/2020   Cough 04/07/2020   Neuropathy 06/03/2019   Type 2 diabetes mellitus with obesity (HCC) 12/02/2018   Diabetic neuropathy (HCC) 12/02/2018   Obesity (BMI 30-39.9) 12/02/2018   HTN (hypertension) 10/21/2018   DOE (dyspnea on exertion) 10/21/2018   Chronic venous insufficiency 10/21/2018   Snoring 04/04/2018   LLL pneumonia 06/24/2014   Well adult exam 11/07/2013   Hyperglycemia 11/30/2010   Vitamin D deficiency 07/20/2009   PSA, INCREASED 07/20/2009   TOBACCO USE, QUIT 07/20/2009   BPH (benign prostatic hyperplasia) 04/14/2008   PARESTHESIA 04/14/2008    PCP: Tresa Garter, MD   REFERRING PROVIDER: Rodolph Bong, MD  REFERRING DIAG: M54.50 (ICD-10-CM) - Acute left-sided low back pain without sciatica  Rationale for Evaluation and Treatment: Rehabilitation  THERAPY DIAG:  Other low back pain  Muscle weakness (generalized)  ONSET DATE: 2-3 weeks ago  SUBJECTIVE:                                                                                                                                                                                            SUBJECTIVE STATEMENT: Reports he had had pain a few years ago and then it is gone. States he works ad he has pain. Usually he stretches and it feels better. States that this time the stretches are making it worse. States that he has not seen any improvement. States he feels weak in the morning.  States that he is  not sleeping well. So he is sleeping a little during the day. States that in the AM the pain is so bad he cannot move. States that he has spasms. States he used a Scientist, clinical (histocompatibility and immunogenetics) and it felt better initially but he can barely can walk when he has pain. States he bends down to stretch and it gradually gets better. States he feels like he wants to lean backwards. Typically sleeps on side.   Reports he has to hold his back with walking to help. Stretches he has tried SLR, Lumbar ROM in all directions in standing,     PERTINENT HISTORY:  DB, HTN, CAD  PAIN:  Are you having pain? Yes: NPRS scale: 3/10 Pain location: LBP left side  Pain description: tight, spasms, 7/10 in AM  Aggravating factors: AM Relieving factors: holding low back  PRECAUTIONS: None  RED FLAGS: None   WEIGHT BEARING RESTRICTIONS: No  FALLS:  Has patient fallen in last 6 months? No   PLOF: Independent  PATIENT GOALS: to have less pain  NEXT MD VISIT: 04/25/23  OBJECTIVE:  Note: Objective measures were completed at Evaluation unless otherwise noted.  DIAGNOSTIC FINDINGS:  xray lumbar 03/14/23-waiting results  PATIENT SURVEYS:  FOTO will perform in future session  SCREENING FOR RED FLAGS: Bowel or bladder incontinence: No Spinal tumors: No Cauda equina syndrome: No Compression fracture: No Abdominal aneurysm: No  COGNITION: Overall cognitive status: Within functional limits for tasks assessed     SENSATION: Not tested     POSTURE: rounded  shoulders, forward head, and anterior pelvic tilt  PALPATION: increased resting tone in left lumbar paraspinals   LUMBAR ROM:   AROM eval  Flexion 25% limited  Extension 90% limited  Right lateral flexion 25% limited  Left lateral flexion 75% limited*  Right rotation   Left rotation    (Blank rows = not tested)    LE Measurements Lower Extremity Right EVAL Left EVAL   A/PROM MMT A/PROM MMT  Hip Flexion      Hip Extension 5 4- 5 4-  Hip Abduction      Hip Adduction      Hip Internal rotation 0*  30*   Hip External rotation 30  45   Knee Flexion      Knee Extension      Ankle Dorsiflexion      Ankle Plantarflexion      Ankle Inversion      Ankle Eversion       (Blank rows = not tested) * pain   LUMBAR SPECIAL TESTS:   SLR + B, SLUMP + B, prone extensions -reduced pain, traction - reduced pain  FUNCTIONAL TESTS:  Pain and holds breath with all bed mobilities and transitional movements   GAIT: Distance walked: 25 ft withiin clinic Assistive device utilized: None Level of assistance: Modified independence Comments: holds left low back, wide base of support, limited trunk rotation  TODAY'S TREATMENT:  DATE:   03/19/2023  Therapeutic Exercise:  Aerobic: Supine: 90/90 position- with LTR x20 B, Hip IR/ER - tolerated mod well -2 minutes Prone: lying 3 minutes, press ups x15, with side bending press ups x10  Seated:  Standing: Neuromuscular Re-education: Manual Therapy: lumbar traction on ball 5 minutes  Therapeutic Activity: Self Care: Trigger Point Dry Needling:  Modalities:    PATIENT EDUCATION:  Education details: on current presentation, on HEP, on clinical outcomes score and POC, about different sleeping postures/positions and using pillow between knees to reduce stress on hip/low back Person educated: Patient Education method:  Programmer, multimedia, Demonstration, and Handouts Education comprehension: verbalized understanding   HOME EXERCISE PROGRAM: [id number]  ASSESSMENT:  CLINICAL IMPRESSION: Patient presents to PT with complaints of acute low back pain that started a few weeks ago and has not improved. Patient presents pain with all movements and increased pain returning to loaded position. Educated patient on current presentation and importance of breathing with movement. Tolerated prone exercises well but continued to have pain at end of session. Patient presents with limitations in ROM, strength and overall function and would greatly benefit from skilled PT to improve function and QOL.  OBJECTIVE IMPAIRMENTS: Abnormal gait, decreased activity tolerance, decreased balance, decreased mobility, difficulty walking, decreased ROM, decreased strength, improper body mechanics, postural dysfunction, and pain.   ACTIVITY LIMITATIONS: carrying, lifting, bending, standing, squatting, sleeping, stairs, transfers, bed mobility, and locomotion level  PARTICIPATION LIMITATIONS: meal prep, cleaning, community activity, and yard work  PERSONAL FACTORS: Fitness and 1-2 comorbidities: DB, HTN  are also affecting patient's functional outcome.   REHAB POTENTIAL: Good  CLINICAL DECISION MAKING: Stable/uncomplicated  EVALUATION COMPLEXITY: Low   GOALS: Goals reviewed with patient? yes  SHORT TERM GOALS: Target date: 04/30/2023   Patient will be independent in self management strategies to improve quality of life and functional outcomes. Baseline: New Program Goal status: INITIAL  2.  Patient will report at least 50% improvement in overall symptoms and/or function to demonstrate improved functional mobility Baseline: 0% better Goal status: INITIAL  3.  Patient will be able to perform all bed mobilities and transitional movements without pain. Baseline: painful Goal status: INITIAL       LONG TERM GOALS: Target date:  06/11/2023    Patient will report at least 75% improvement in overall symptoms and/or function to demonstrate improved functional mobility Baseline: 0% better Goal status: INITIAL  2.  Patient will be able to sleep through the night without waking up due to pain. Baseline: painful Goal status: INITIAL  3.  Patient will be able to demonstrate painfree lumbar ROM. Baseline: painful Goal status: INITIAL      PLAN:  PT FREQUENCY: 1-2x/week for a total of 16 visits over 12 week certification period  PT DURATION: 12 weeks  PLANNED INTERVENTIONS: 97110-Therapeutic exercises, 97530- Therapeutic activity, 97112- Neuromuscular re-education, 97535- Self Care, 46962- Manual therapy, [phone]- Gait training, [phone]- Orthotic Fit/training, [phone]- Canalith repositioning, [id number]- Aquatic Therapy, 97014- Electrical stimulation (unattended), [phone]- Ionotophoresis 4mg /ml Dexamethasone, Patient/Family education, Balance training, Stair training, Taping, Dry Needling, Joint mobilization, Joint manipulation, Spinal manipulation, Spinal mobilization, Cryotherapy, and Moist heat   PLAN FOR NEXT SESSION: FOTO, revisit lumbar extension, STM/percussion gun, revisit traction/self traction, long exhale breathing, thomas stretch, isometrics, manual as indicated   12:41 PM, 03/19/23 Tereasa Coop, DPT Physical Therapy with [facility]

## 2023-03-19 ENCOUNTER — Encounter: Payer: Self-pay | Admitting: Physical Therapy

## 2023-03-19 ENCOUNTER — Ambulatory Visit: Payer: Medicare PPO | Admitting: Physical Therapy

## 2023-03-19 DIAGNOSIS — M6281 Muscle weakness (generalized): Secondary | ICD-10-CM

## 2023-03-19 DIAGNOSIS — M5459 Other low back pain: Secondary | ICD-10-CM

## 2023-03-21 ENCOUNTER — Encounter: Payer: Self-pay | Admitting: Physical Therapy

## 2023-03-21 ENCOUNTER — Ambulatory Visit: Payer: Medicare PPO | Admitting: Physical Therapy

## 2023-03-21 DIAGNOSIS — M5459 Other low back pain: Secondary | ICD-10-CM | POA: Diagnosis not present

## 2023-03-21 DIAGNOSIS — M6281 Muscle weakness (generalized): Secondary | ICD-10-CM

## 2023-03-21 NOTE — Therapy (Signed)
OUTPATIENT PHYSICAL THERAPY THORACOLUMBAR TREATMENT   Patient Name: Drew Hansen MRN: [id number] DOB:1950/07/05, 72 y.o., male Today's Date: 03/21/2023  END OF SESSION:  PT End of Session - 03/21/23 1344     Visit Number 2    Number of Visits 16    Date for PT Re-Evaluation 06/11/23    Authorization Type humana- auth 12 visits approved 10/28 to 05/22/23    Authorization - Visit Number 2    Authorization - Number of Visits 12    Progress Note Due on Visit 10    PT Start Time 1345    PT Stop Time 1428    PT Time Calculation (min) 43 min    Activity Tolerance Patient limited by pain    Behavior During Therapy [facility] for tasks assessed/performed             Past Medical History:  Diagnosis Date   BPH (benign prostatic hyperplasia) 2007   Elevated PSA, s/p bx Dr Vernie Ammons   Past Surgical History:  Procedure Laterality Date   PROSTATE BIOPSY     Dr Vernie Ammons - he did not go?   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/21/2021   Tinnitus of both ears 07/21/2021   Hearing loss 04/25/2021   Edema 04/25/2021   CAD (coronary artery disease) 12/22/2020   Cough 04/07/2020   Neuropathy 06/03/2019   Type 2 diabetes mellitus with obesity (HCC) 12/02/2018   Diabetic neuropathy (HCC) 12/02/2018   Obesity (BMI 30-39.9) 12/02/2018   HTN (hypertension) 10/21/2018   DOE (dyspnea on exertion) 10/21/2018   Chronic venous insufficiency 10/21/2018   Snoring 04/04/2018   LLL pneumonia 06/24/2014   Well adult exam 11/07/2013   Hyperglycemia 11/30/2010   Vitamin D deficiency 07/20/2009   PSA, INCREASED 07/20/2009   TOBACCO USE, QUIT 07/20/2009   BPH (benign prostatic hyperplasia) 04/14/2008   PARESTHESIA 04/14/2008    PCP: Tresa Garter, MD   REFERRING PROVIDER: Rodolph Bong, MD  REFERRING DIAG: M54.50 (ICD-10-CM) - Acute left-sided low back pain without sciatica  Rationale for Evaluation and Treatment: Rehabilitation  THERAPY  DIAG:  Other low back pain  Muscle weakness (generalized)  ONSET DATE: 2-3 weeks ago  SUBJECTIVE:                                                                                                                                                                                           SUBJECTIVE STATEMENT: 03/21/2023 States he tried the exercise int he morning and he had increased pain in his back. States that he walked about 4K steps yesterday and he  had increased pain. Reports he is having more pain and not sleeping well so he is tired. Tried a pillow between his knees and this did not help. Does feel like the left side pain is better after the exercises.   Eval: Reports he had had pain a few years ago and then it is gone. States he works ad he has pain. Usually he stretches and it feels better. States that this time the stretches are making it worse. States that he has not seen any improvement. States he feels weak in the morning.  States that he is not sleeping well. So he is sleeping a little during the day. States that in the AM the pain is so bad he cannot move. States that he has spasms. States he used a Scientist, clinical (histocompatibility and immunogenetics) and it felt better initially but he can barely can walk when he has pain. States he bends down to stretch and it gradually gets better. States he feels like he wants to lean backwards. Typically sleeps on side.   Reports he has to hold his back with walking to help. Stretches he has tried SLR, Lumbar ROM in all directions in standing,     PERTINENT HISTORY:  DB, HTN, CAD  PAIN:  Are you having pain? Yes: NPRS scale: 3/10 Pain location: LBP left side  Pain description: tight, spasms, 7/10 in AM  Aggravating factors: AM Relieving factors: holding low back  PRECAUTIONS: None  RED FLAGS: None   WEIGHT BEARING RESTRICTIONS: No  FALLS:  Has patient fallen in last 6 months? No   PLOF: Independent  PATIENT GOALS: to have less pain  NEXT MD VISIT:  04/25/23  OBJECTIVE:  Note: Objective measures were completed at Evaluation unless otherwise noted.  DIAGNOSTIC FINDINGS:  xray lumbar 03/14/23-waiting results  PATIENT SURVEYS:  FOTO will perform in future session  SCREENING FOR RED FLAGS: Bowel or bladder incontinence: No Spinal tumors: No Cauda equina syndrome: No Compression fracture: No Abdominal aneurysm: No  COGNITION: Overall cognitive status: Within functional limits for tasks assessed     SENSATION: Not tested     POSTURE: rounded shoulders, forward head, and anterior pelvic tilt  PALPATION: increased resting tone in left lumbar paraspinals   LUMBAR ROM:   AROM eval  Flexion 25% limited  Extension 90% limited  Right lateral flexion 25% limited  Left lateral flexion 75% limited*  Right rotation   Left rotation    (Blank rows = not tested)    LE Measurements Lower Extremity Right EVAL Left EVAL   A/PROM MMT A/PROM MMT  Hip Flexion      Hip Extension 5 4- 5 4-  Hip Abduction      Hip Adduction      Hip Internal rotation 0*  30*   Hip External rotation 30  45   Knee Flexion      Knee Extension      Ankle Dorsiflexion      Ankle Plantarflexion      Ankle Inversion      Ankle Eversion       (Blank rows = not tested) * pain   LUMBAR SPECIAL TESTS:   SLR + B, SLUMP + B, prone extensions -reduced pain, traction - reduced pain  FUNCTIONAL TESTS:  Pain and holds breath with all bed mobilities and transitional movements   GAIT: Distance walked: 25 ft withiin clinic Assistive device utilized: None Level of assistance: Modified independence Comments: holds left low back, wide base of support, limited trunk rotation  TODAY'S TREATMENT:                                                                                                                              DATE:   03/21/2023  Therapeutic Exercise:  Aerobic: Supine: 90/90 position- breathing/relaxing with heat in this position, alternating  hip IR 2 minutes, laying over towel roll in lumbar spine for lumbar extension 4 minutes total  Prone: lying 3 minutes, press up position on pillows 5 minutes    Seated:  Standing: Neuromuscular Re-education: long exhale breathing for core activation 5 minutes Manual Therapy: lumbar traction on ball 5 minutes, percussion gun with vibration and towel to lumbar paraspinals - no change in symptoms, left hip long axis traction - no change in symptoms. Therapeutic Activity: Self Care: Trigger Point Dry Needling:  Modalities: thermotherapy to low back during supine interventions   PATIENT EDUCATION:  Education details: on HEP, on anatomy and rationale behind interventions - used model to explain anatomy of spine, educated patient and rationale of breathing mechanics and core activation Person educated: Patient Education method: Explanation, Demonstration, and Handouts Education comprehension: verbalized understanding   HOME EXERCISE PROGRAM: [id number]  ASSESSMENT:  CLINICAL IMPRESSION: 03/21/2023 Session continue to focus on pain management strategies.  Did not have any improvement in symptoms with long axis traction of left lower extremity.  Had slight reduction in symptoms with lumbar traction and had slight reduction in symptoms with lumbar extension but symptoms immediately returned upon transition to sitting upright.  Cued patient for long exhale breathing to help engage core as he tends to hold his breath causing increased intra-abdominal pressure.  Patient would continue to benefit from skilled PT focusing on pain management strategies, lumbar extension and muscle activation exercises.   Eval: Patient presents to PT with complaints of acute low back pain that started a few weeks ago and has not improved. Patient presents pain with all movements and increased pain returning to loaded position. Educated patient on current presentation and importance of breathing with movement. Tolerated  prone exercises well but continued to have pain at end of session. Patient presents with limitations in ROM, strength and overall function and would greatly benefit from skilled PT to improve function and QOL.  OBJECTIVE IMPAIRMENTS: Abnormal gait, decreased activity tolerance, decreased balance, decreased mobility, difficulty walking, decreased ROM, decreased strength, improper body mechanics, postural dysfunction, and pain.   ACTIVITY LIMITATIONS: carrying, lifting, bending, standing, squatting, sleeping, stairs, transfers, bed mobility, and locomotion level  PARTICIPATION LIMITATIONS: meal prep, cleaning, community activity, and yard work  PERSONAL FACTORS: Fitness and 1-2 comorbidities: DB, HTN  are also affecting patient's functional outcome.   REHAB POTENTIAL: Good  CLINICAL DECISION MAKING: Stable/uncomplicated  EVALUATION COMPLEXITY: Low   GOALS: Goals reviewed with patient? yes  SHORT TERM GOALS: Target date: 04/30/2023   Patient will be independent in self management strategies to improve quality of life and functional outcomes. Baseline: New Program Goal status: INITIAL  2.  Patient will report at least 50% improvement in overall symptoms and/or function to demonstrate improved functional mobility Baseline: 0% better Goal status: INITIAL  3.  Patient will be able to perform all bed mobilities and transitional movements without pain. Baseline: painful Goal status: INITIAL       LONG TERM GOALS: Target date: 06/11/2023    Patient will report at least 75% improvement in overall symptoms and/or function to demonstrate improved functional mobility Baseline: 0% better Goal status: INITIAL  2.  Patient will be able to sleep through the night without waking up due to pain. Baseline: painful Goal status: INITIAL  3.  Patient will be able to demonstrate painfree lumbar ROM. Baseline: painful Goal status: INITIAL      PLAN:  PT FREQUENCY: 1-2x/week for a total of  16 visits over 12 week certification period  PT DURATION: 12 weeks  PLANNED INTERVENTIONS: 97110-Therapeutic exercises, 97530- Therapeutic activity, 97112- Neuromuscular re-education, 97535- Self Care, 40981- Manual therapy, [phone]- Gait training, [phone]- Orthotic Fit/training, [phone]- Canalith repositioning, [id number]- Aquatic Therapy, 97014- Electrical stimulation (unattended), [phone]- Ionotophoresis 4mg /ml Dexamethasone, Patient/Family education, Balance training, Stair training, Taping, Dry Needling, Joint mobilization, Joint manipulation, Spinal manipulation, Spinal mobilization, Cryotherapy, and Moist heat   PLAN FOR NEXT SESSION: lumbar traction, lumbar extension--> with ROT?, long exhale breathing, thomas stretch, isometrics, manual as indicated  Pain management strategies no change in symptoms with heat/percussion gun   3:14 PM, 03/21/23 Tereasa Coop, DPT Physical Therapy with [facility]

## 2023-03-26 ENCOUNTER — Encounter: Payer: Medicare PPO | Admitting: Physical Therapy

## 2023-03-26 ENCOUNTER — Encounter: Payer: Self-pay | Admitting: Internal Medicine

## 2023-03-26 ENCOUNTER — Encounter: Payer: Self-pay | Admitting: Family Medicine

## 2023-03-27 ENCOUNTER — Encounter: Payer: Self-pay | Admitting: Family Medicine

## 2023-03-28 ENCOUNTER — Encounter: Payer: Self-pay | Admitting: Internal Medicine

## 2023-03-28 ENCOUNTER — Telehealth: Payer: Self-pay | Admitting: Family Medicine

## 2023-03-28 ENCOUNTER — Encounter: Payer: Self-pay | Admitting: Physical Therapy

## 2023-03-28 ENCOUNTER — Encounter: Payer: Self-pay | Admitting: Family Medicine

## 2023-03-28 ENCOUNTER — Ambulatory Visit: Payer: Medicare PPO | Admitting: Physical Therapy

## 2023-03-28 DIAGNOSIS — M6281 Muscle weakness (generalized): Secondary | ICD-10-CM | POA: Diagnosis not present

## 2023-03-28 DIAGNOSIS — M5459 Other low back pain: Secondary | ICD-10-CM | POA: Diagnosis not present

## 2023-03-28 DIAGNOSIS — M545 Low back pain, unspecified: Secondary | ICD-10-CM

## 2023-03-28 NOTE — Telephone Encounter (Signed)
MRI ordered

## 2023-03-28 NOTE — Therapy (Signed)
Discussed pt case with referring MD (Dr. [name]) after session- worsening pain patterns, difficulty walking and bearing wt L LE, poor tolerance to session today even given conservative interventions like IFC and ice.   In agreement that due to worsening pain and mobility, we will hold PT until after MRI. Will call pt with updated POC today.   Nedra Hai, PT, DPT 03/28/23 2:57 PM

## 2023-03-28 NOTE — Therapy (Addendum)
OUTPATIENT PHYSICAL THERAPY THORACOLUMBAR TREATMENT PHYSICAL THERAPY DISCHARGE SUMMARY  Visits from Start of Care: 3  Current functional level related to goals / functional outcomes: Could not Reassess due to unplanned Discharge   Remaining deficits: Could not Reassess due to unplanned Discharge   Education / Equipment: Could not Reassess due to unplanned Discharge   Patient agrees to discharge. Patient goals were not met. Patient is being discharged due to not returning since the last visit. Pt followed up with MD and received injection where he felt better and did not need to return to PT.   10:38 AM, 05/02/23 Tereasa Coop, DPT Physical Therapy with [place]    Patient Name: Nathon Marple MRN: [id number] DOB:[birth year], 72 y.o., male Today's Date: 03/28/2023  END OF SESSION:  PT End of Session - 03/28/23 1342     Visit Number 3    Number of Visits 16    Date for PT Re-Evaluation 06/11/23    Authorization Type humana- auth 12 visits approved 10/28 to 05/22/23    Authorization - Number of Visits 12    Progress Note Due on Visit 10    PT Start Time 1345    PT Stop Time 1420   session limited by pt tolerance   PT Time Calculation (min) 35 min    Behavior During Therapy [facility] for tasks assessed/performed              Past Medical History:  Diagnosis Date   BPH (benign prostatic hyperplasia) 2007   Elevated PSA, s/p bx Dr Vernie Ammons   Past Surgical History:  Procedure Laterality Date   PROSTATE BIOPSY     Dr Vernie Ammons - he did not go?   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/21/2021   Tinnitus of both ears 07/21/2021   Hearing loss 04/25/2021   Edema 04/25/2021   CAD (coronary artery disease) 12/22/2020   Cough 04/07/2020   Neuropathy 06/03/2019   Type 2 diabetes mellitus with obesity (HCC) 12/02/2018   Diabetic neuropathy (HCC) 12/02/2018   Obesity (BMI 30-39.9) 12/02/2018   HTN (hypertension) 10/21/2018    DOE (dyspnea on exertion) 10/21/2018   Chronic venous insufficiency 10/21/2018   Snoring 04/04/2018   LLL pneumonia 06/24/2014   Well adult exam 11/07/2013   Hyperglycemia 11/30/2010   Vitamin D deficiency 07/20/2009   PSA, INCREASED 07/20/2009   TOBACCO USE, QUIT 07/20/2009   BPH (benign prostatic hyperplasia) 04/14/2008   PARESTHESIA 04/14/2008    PCP: Tresa Garter, MD   REFERRING PROVIDER: Rodolph Bong, MD  REFERRING DIAG: M54.50 (ICD-10-CM) - Acute left-sided low back pain without sciatica  Rationale for Evaluation and Treatment: Rehabilitation  THERAPY DIAG:  Other low back pain  Muscle weakness (generalized)  ONSET DATE: 2-3 weeks ago  SUBJECTIVE:  SUBJECTIVE STATEMENT: 03/28/2023  Pain is really bad today, easily a 10. I started having spasms all the time, I told the doctor that I cannot go to PT (it does not help or I physically cannot do things). Doctor sent me anyway. I feel like I didn't get any proper advice on how to manage this, cold packs help in the moment but then pain is worse or the same. Heat does nothing for me. I'd really like to do something to reduce the pain.     Eval: Reports he had had pain a few years ago and then it is gone. States he works ad he has pain. Usually he stretches and it feels better. States that this time the stretches are making it worse. States that he has not seen any improvement. States he feels weak in the morning.  States that he is not sleeping well. So he is sleeping a little during the day. States that in the AM the pain is so bad he cannot move. States that he has spasms. States he used a Scientist, clinical (histocompatibility and immunogenetics) and it felt better initially but he can barely can walk when he has pain. States he bends down to stretch and it gradually gets better. States  he feels like he wants to lean backwards. Typically sleeps on side.   Reports he has to hold his back with walking to help. Stretches he has tried SLR, Lumbar ROM in all directions in standing,     PERTINENT HISTORY:  DB, HTN, CAD  PAIN:  Are you having pain? Yes: NPRS scale: 10/10 Pain location: LBP left side  Pain description: tight, spasms  Aggravating factors: AM Relieving factors: holding low back  PRECAUTIONS: None  RED FLAGS: None   WEIGHT BEARING RESTRICTIONS: No  FALLS:  Has patient fallen in last 6 months? No   PLOF: Independent  PATIENT GOALS: to have less pain  NEXT MD VISIT: 04/25/23  OBJECTIVE:  Note: Objective measures were completed at Evaluation unless otherwise noted.  DIAGNOSTIC FINDINGS:  xray lumbar 03/14/23-waiting results  PATIENT SURVEYS:  FOTO will perform in future session  SCREENING FOR RED FLAGS: Bowel or bladder incontinence: No Spinal tumors: No Cauda equina syndrome: No Compression fracture: No Abdominal aneurysm: No  COGNITION: Overall cognitive status: Within functional limits for tasks assessed     SENSATION: Not tested     POSTURE: rounded shoulders, forward head, and anterior pelvic tilt  PALPATION: increased resting tone in left lumbar paraspinals   LUMBAR ROM:   AROM eval  Flexion 25% limited  Extension 90% limited  Right lateral flexion 25% limited  Left lateral flexion 75% limited*  Right rotation   Left rotation    (Blank rows = not tested)    LE Measurements Lower Extremity Right EVAL Left EVAL   A/PROM MMT A/PROM MMT  Hip Flexion      Hip Extension 5 4- 5 4-  Hip Abduction      Hip Adduction      Hip Internal rotation 0*  30*   Hip External rotation 30  45   Knee Flexion      Knee Extension      Ankle Dorsiflexion      Ankle Plantarflexion      Ankle Inversion      Ankle Eversion       (Blank rows = not tested) * pain   LUMBAR SPECIAL TESTS:   SLR + B, SLUMP + B, prone  extensions -reduced pain, traction - reduced pain  FUNCTIONAL TESTS:  Pain and holds breath with all bed mobilities and transitional movements   GAIT: Distance walked: 25 ft withiin clinic Assistive device utilized: None Level of assistance: Modified independence Comments: holds left low back, wide base of support, limited trunk rotation  TODAY'S TREATMENT:                                                                                                                              DATE:   03/28/2023    Therapeutic Exercise:  Aerobic: Supine:  Prone:minutes    Seated:  Standing: Neuromuscular Re-education:  Manual Therapy: Therapeutic Activity: Self Care: Trigger Point Dry Needling:  Modalities: prone IFC x10 minutes 13v-14v to L paraspinals on premod settings for chronic pain/4 electrodes (skin prep as appropriate and skin intact/without signs of injury after intervention)          Therapeutic Exercise:  Aerobic: Supine: 90/90 position- breathing/relaxing with heat in this position, alternating hip IR 2 minutes, laying over towel roll in lumbar spine for lumbar extension 4 minutes total  Prone: lying 3 minutes, press up position on pillows 5 minutes    Seated:  Standing: Neuromuscular Re-education: long exhale breathing for core activation 5 minutes Manual Therapy: lumbar traction on ball 5 minutes, percussion gun with vibration and towel to lumbar paraspinals - no change in symptoms, left hip long axis traction - no change in symptoms. Therapeutic Activity: Self Care: Trigger Point Dry Needling:  Modalities: thermotherapy to low back during supine interventions   PATIENT EDUCATION:  Education details: on HEP, on anatomy and rationale behind interventions - used model to explain anatomy of spine, educated patient and rationale of breathing mechanics and core activation Person educated: Patient Education method: Programmer, multimedia, Demonstration, and Handouts Education  comprehension: verbalized understanding   HOME EXERCISE PROGRAM: [id number]  ASSESSMENT:  CLINICAL IMPRESSION: 03/28/2023   Pt arrived in extreme pain today, very frustrated that no interventions from MD or PT have been successful in addressing his pain thus far. Trailed IFC to L paraspinals as above, deferred other interventions due to severe pain today. Did have relief in prone with IFC but hard to tell if this was from prone positioning (usually relieves pain) vs IFC itself.  If estim was successful in managing pain, would really benefit from home unit. Education given today about different types of PT interventions, benefits of electricity given severe pain levels preventing him from participating in other activities, mechanism of IFC/estim in pain control. Pain improved from 10/10 to 7/10 in standing after IFC, deferred other activities today due to concern of re-flaring pain to severe level at start of session. Pt attempted to leave as normal at EOS however was unable to bear wt on L LE, reports that this happened to him Monday as well and ice helped. Placed ice pack with another PT in clinic providing direct supervision to pt, offered EMS transport but pt declined.   Of note, low back pain has  been progressively more severe and has not been responding to PT interventions. I messaged referring MD with update on increasing pain levels and PT concerns, discussed MRI.   Eval: Patient presents to PT with complaints of acute low back pain that started a few weeks ago and has not improved. Patient presents pain with all movements and increased pain returning to loaded position. Educated patient on current presentation and importance of breathing with movement. Tolerated prone exercises well but continued to have pain at end of session. Patient presents with limitations in ROM, strength and overall function and would greatly benefit from skilled PT to improve function and QOL.  OBJECTIVE IMPAIRMENTS:  Abnormal gait, decreased activity tolerance, decreased balance, decreased mobility, difficulty walking, decreased ROM, decreased strength, improper body mechanics, postural dysfunction, and pain.   ACTIVITY LIMITATIONS: carrying, lifting, bending, standing, squatting, sleeping, stairs, transfers, bed mobility, and locomotion level  PARTICIPATION LIMITATIONS: meal prep, cleaning, community activity, and yard work  PERSONAL FACTORS: Fitness and 1-2 comorbidities: DB, HTN  are also affecting patient's functional outcome.   REHAB POTENTIAL: Good  CLINICAL DECISION MAKING: Stable/uncomplicated  EVALUATION COMPLEXITY: Low   GOALS: Goals reviewed with patient? yes  SHORT TERM GOALS: Target date: 04/30/2023   Patient will be independent in self management strategies to improve quality of life and functional outcomes. Baseline: New Program Goal status: INITIAL  2.  Patient will report at least 50% improvement in overall symptoms and/or function to demonstrate improved functional mobility Baseline: 0% better Goal status: INITIAL  3.  Patient will be able to perform all bed mobilities and transitional movements without pain. Baseline: painful Goal status: INITIAL       LONG TERM GOALS: Target date: 06/11/2023    Patient will report at least 75% improvement in overall symptoms and/or function to demonstrate improved functional mobility Baseline: 0% better Goal status: INITIAL  2.  Patient will be able to sleep through the night without waking up due to pain. Baseline: painful Goal status: INITIAL  3.  Patient will be able to demonstrate painfree lumbar ROM. Baseline: painful Goal status: INITIAL      PLAN:  PT FREQUENCY: 1-2x/week for a total of 16 visits over 12 week certification period  PT DURATION: 12 weeks  PLANNED INTERVENTIONS: 97110-Therapeutic exercises, 97530- Therapeutic activity, 97112- Neuromuscular re-education, 97535- Self Care, 16109- Manual therapy, [phone]-  Gait training, [phone]- Orthotic Fit/training, [phone]- Canalith repositioning, [id number]- Aquatic Therapy, 97014- Electrical stimulation (unattended), [phone]- Ionotophoresis 4mg /ml Dexamethasone, Patient/Family education, Balance training, Stair training, Taping, Dry Needling, Joint mobilization, Joint manipulation, Spinal manipulation, Spinal mobilization, Cryotherapy, and Moist heat   PLAN FOR NEXT SESSION: lumbar traction, lumbar extension--> with ROT?, long exhale breathing, thomas stretch, isometrics, manual as indicated. How did he feel after IFC last session- repeat if it was helpful (electrodes in drawer in Michelle's office) and recommend home unit if appropriate. Refer back to MD if pain consistently does not improve   Pain management strategies no change in symptoms with heat/percussion gun   Nedra Hai, PT, DPT 03/28/23 2:34 PM

## 2023-03-29 ENCOUNTER — Ambulatory Visit
Admission: RE | Admit: 2023-03-29 | Discharge: 2023-03-29 | Disposition: A | Discharge status: Home / Self Care | Payer: Medicare PPO | Source: Ambulatory Visit | Attending: Family Medicine | Admitting: Family Medicine

## 2023-03-29 DIAGNOSIS — M545 Low back pain, unspecified: Secondary | ICD-10-CM | POA: Diagnosis not present

## 2023-03-29 NOTE — Progress Notes (Signed)
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

## 2023-03-30 ENCOUNTER — Telehealth: Payer: Self-pay | Admitting: Family Medicine

## 2023-03-30 DIAGNOSIS — M5442 Lumbago with sciatica, left side: Secondary | ICD-10-CM

## 2023-03-30 NOTE — Progress Notes (Signed)
Lumbar spine MRI shows some arthritis that could cause back pain and areas were nerves are being pinched causing pain to go down your legs. I am going to order a back injection that I am hopeful will help your back pain.  There are other injections that we could arrange for as well if this does not work.  Please stop aspirin now if you are taking it.  You should hear from [facility] imaging soon about scheduling the MRI.

## 2023-03-30 NOTE — Telephone Encounter (Signed)
Epidural steroid injection ordered..  Lumbar spine MRI shows some arthritis that could cause back pain and areas were nerves are being pinched causing pain to go down your legs. I am going to order a back injection that I am hopeful will help your back pain.  There are other injections that we could arrange for as well if this does not work.  Please stop aspirin now if you are taking it.  You should hear from [facility] imaging soon about scheduling the MRI.

## 2023-04-02 ENCOUNTER — Encounter: Payer: Medicare PPO | Admitting: Physical Therapy

## 2023-04-04 ENCOUNTER — Encounter: Payer: Medicare PPO | Admitting: Physical Therapy

## 2023-04-04 DIAGNOSIS — M9904 Segmental and somatic dysfunction of sacral region: Secondary | ICD-10-CM | POA: Diagnosis not present

## 2023-04-04 DIAGNOSIS — M9902 Segmental and somatic dysfunction of thoracic region: Secondary | ICD-10-CM | POA: Diagnosis not present

## 2023-04-06 NOTE — Discharge Instructions (Signed)

## 2023-04-09 ENCOUNTER — Inpatient Hospital Stay
Admission: RE | Admit: 2023-04-09 | Discharge: 2023-04-09 | Disposition: A | Discharge status: Home / Self Care | Payer: Medicare PPO | Source: Ambulatory Visit | Attending: Family Medicine | Admitting: Family Medicine

## 2023-04-09 ENCOUNTER — Encounter: Payer: Medicare PPO | Admitting: Physical Therapy

## 2023-04-09 DIAGNOSIS — M5441 Lumbago with sciatica, right side: Secondary | ICD-10-CM

## 2023-04-09 DIAGNOSIS — M4727 Other spondylosis with radiculopathy, lumbosacral region: Secondary | ICD-10-CM | POA: Diagnosis not present

## 2023-04-09 MED ORDER — IOPAMIDOL (ISOVUE-M 200) INJECTION 41%
1.0000 mL | Freq: Once | INTRAMUSCULAR | Status: AC
  Administered 2023-04-09: 1 mL via EPIDURAL

## 2023-04-09 MED ORDER — METHYLPREDNISOLONE ACETATE 40 MG/ML INJ SUSP (RADIOLOG
80.0000 mg | Freq: Once | INTRAMUSCULAR | Status: AC
  Administered 2023-04-09: 80 mg via EPIDURAL

## 2023-04-11 ENCOUNTER — Encounter: Payer: Medicare PPO | Admitting: Physical Therapy

## 2023-04-16 ENCOUNTER — Encounter: Payer: Medicare PPO | Admitting: Physical Therapy

## 2023-04-18 ENCOUNTER — Encounter: Payer: Medicare PPO | Admitting: Physical Therapy

## 2023-04-25 ENCOUNTER — Ambulatory Visit: Payer: Medicare PPO | Admitting: Family Medicine

## 2023-04-25 VITALS — BP 130/86 | HR 86 | Ht 68.0 in | Wt 249.0 lb

## 2023-04-25 DIAGNOSIS — M5441 Lumbago with sciatica, right side: Secondary | ICD-10-CM | POA: Diagnosis not present

## 2023-04-25 DIAGNOSIS — M5442 Lumbago with sciatica, left side: Secondary | ICD-10-CM | POA: Diagnosis not present

## 2023-04-25 NOTE — Progress Notes (Signed)
Drew Payor, PhD, LAT, ATC acting as a scribe for [name], MD.  Drew Hansen is a 72 y.o. male who presents to [facility] at [facility] today for f/u LBP w/ MRI review. Pt was last seen by Dr. Denyse Amass on 03/14/23 and was advised to use a heating pad and prescribed tizanidine. He was also referred to PT, 3 visits. PT contacted [place], reporting worsening LBP and a MRI was ordered. Based on finding, ESI was ordered, performed on 04/09/23.  Today, pt reports much improvement in his LBP, he rates his pain at 1-2/10 at rest. And pain only slightly increases w/ activity.   Dx imaging: 03/29/23 L-spine MRI 03/14/23 L-spine XR 11/07/13 L-spine XR   Pertinent review of systems: No fevers or chills  Relevant historical information: Hypertension and diabetic neuropathy   Exam:  BP 130/86   Pulse 86   Ht 5\' 8"  (1.727 m)   Wt 249 lb (112.9 kg)   SpO2 93%   BMI 37.86 kg/m  General: Well Developed, well nourished, and in no acute distress.   MSK: L-spine decreased lumbar motion.    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 Left-sided low back and leg pain. No prior surgery or injection. FLUOROSCOPY: Radiation Exposure Index (as provided by the fluoroscopic device): 11.7 mGy Kerma PROCEDURE: The procedure, risks, benefits, and alternatives were explained to the patient. Questions regarding the procedure were encouraged and answered. The patient understands and consents to the procedure. LUMBAR EPIDURAL INJECTION: An interlaminar approach was performed on the left at L4-L5. The overlying skin was cleansed and anesthetized. A 3.5 inch 20 gauge epidural needle was advanced using loss-of-resistance technique. DIAGNOSTIC EPIDURAL INJECTION: Injection of Isovue-M 200 shows a good epidural pattern with spread above and below the  level of needle placement, primarily on the left. No vascular opacification is seen. THERAPEUTIC EPIDURAL INJECTION: 80 mg of Depo-Medrol mixed with 3 mL of 1% lidocaine were instilled. The procedure was well-tolerated, and the patient was discharged thirty minutes following the injection in good condition. COMPLICATIONS: None immediate. IMPRESSION: Technically successful interlaminar epidural injection on the left at L4-L5. Electronically Signed   By: Obie Dredge M.D.   On: 04/09/2023 12:34   MR Lumbar Spine Wo Contrast  Result Date: 03/29/2023 CLINICAL DATA:  Low back pain, cauda equina syndrome suspected. EXAM: MRI LUMBAR SPINE WITHOUT CONTRAST TECHNIQUE: Multiplanar, multisequence MR imaging of the lumbar spine was performed. No intravenous contrast was administered. COMPARISON:  Lumbar radiography 03/14/2023 FINDINGS: Segmentation:  Standard. Alignment: Mild dextrocurvature. Mild degenerative anterolisthesis at L4-5 and L5-S1. Vertebrae:  No fracture, evidence of discitis, or bone lesion. Conus medullaris and cauda equina: Conus extends to the T12-L1 level. Conus and cauda equina appear normal. Paraspinal and other soft tissues: No perispinal mass or inflammation. Disc levels: T12- L1: Bridging osteophyte.  No neural impingement. L1-L2: Intervertebral T2 attributed to ankylosis from right far-lateral bridging osteophyte. No neural impingement L2-L3: Disc narrowing and bulging. Annular fissure at the right foramen. Patent canal and foramina L3-L4: Disc height loss and bulging. Degenerative facet spurring on both sides. No neural impingement L4-L5: Degenerative facet spurring and ligamentum flavum thickening with mild anterolisthesis. Disc height loss and bulging similar to the other levels. Moderate thecal sac stenosis. There is impingement on both L5 nerve roots at the subarticular recesses. The foramina are patent L5-S1:Degenerative facet spurring with mild anterolisthesis. There is disc  height loss  and circumferential bulging. The canal and foramina are patent. IMPRESSION: 1. Generalized lumbar spine degeneration with mild L4-5 and L5-S1 anterolisthesis. 2. 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 Electronically Signed   By: Tiburcio Pea M.D.   On: 03/29/2023 20:41   DG Lumbar Spine 2-3 Views  Result Date: 03/28/2023 CLINICAL DATA:  Low back pain for 2 weeks, lifting injury EXAM: LUMBAR SPINE - 2-3 VIEW COMPARISON:  11/07/2013, 12/29/2021 FINDINGS: Frontal and lateral views of the lumbar spine are obtained on 3 images. There are 5 non-rib-bearing lumbar type vertebral bodies, with mild right convex curvature centered at the L2-3 level. Otherwise alignment is anatomic. There are no acute fractures. There is extensive multilevel lumbar spondylosis and facet hypertrophy, not significantly changed since prior CT. Disc space narrowing is most pronounced at L1-2, L3-4, and L5-S1. Facet hypertrophic changes are most pronounced from L3-4 through L5-S1. Incidental aortic atherosclerosis. Sacroiliac joints are unremarkable. IMPRESSION: 1. No acute lumbar spine fracture. 2. Stable multilevel lumbar spondylosis and facet hypertrophy as above. 3. Atherosclerosis. Electronically Signed   By: Sharlet Salina M.D.   On: 03/28/2023 15:54   I, [name], personally (independently) visualized and performed the interpretation of the MRI images attached in this note.       Assessment and Plan: 72 y.o. male with chronic low back pain multifactorial.  MRI shows spinal stenosis and facet arthritis.  All of this could be sources of pain but he responded very well to an epidural interlaminar injection at L4-5.  This would indicate that the spinal stenosis at this level was a major pain generator.  He is feeling much better now.  We could repeat the injection if needed in the future.  He also asked about clinic and [place] that is doing a minimally  invasive laser surgery treatment for herniated disks.  Right now he is doing too well to consider that but in the future it may be worth a look.  Check back as needed.  Reviewed the MRI pictures and report.   PDMP not reviewed this encounter. No orders of the defined types were placed in this encounter.  No orders of the defined types were placed in this encounter.    Discussed warning signs or symptoms. Please see discharge instructions. Patient expresses understanding.   The above documentation has been reviewed and is accurate and complete [name], M.D. Total encounter time 20 minutes including face-to-face time with the patient and, reviewing past medical record, and charting on the date of service.

## 2023-04-25 NOTE — Patient Instructions (Signed)
Thank you for coming in today.   Let me know if you need another shot  Check back as needed

## 2023-05-09 ENCOUNTER — Other Ambulatory Visit: Payer: Self-pay | Admitting: Internal Medicine

## 2023-05-09 MED ORDER — OZEMPIC (0.25 OR 0.5 MG/DOSE) 2 MG/3ML ~~LOC~~ SOPN: PEN_INJECTOR | SUBCUTANEOUS | 1 refills | Status: DC

## 2023-05-14 ENCOUNTER — Other Ambulatory Visit: Payer: Self-pay | Admitting: Internal Medicine

## 2023-05-14 ENCOUNTER — Encounter: Payer: Self-pay | Admitting: Internal Medicine

## 2023-05-21 ENCOUNTER — Ambulatory Visit: Payer: Medicare PPO | Admitting: Internal Medicine

## 2023-05-21 ENCOUNTER — Encounter: Payer: Self-pay | Admitting: Internal Medicine

## 2023-05-21 VITALS — BP 120/70 | HR 88 | Temp 98.1°F | Ht 68.0 in | Wt 254.0 lb

## 2023-05-21 DIAGNOSIS — I2583 Coronary atherosclerosis due to lipid rich plaque: Secondary | ICD-10-CM

## 2023-05-21 DIAGNOSIS — Z7985 Long-term (current) use of injectable non-insulin antidiabetic drugs: Secondary | ICD-10-CM

## 2023-05-21 DIAGNOSIS — I2510 Atherosclerotic heart disease of native coronary artery without angina pectoris: Secondary | ICD-10-CM | POA: Diagnosis not present

## 2023-05-21 DIAGNOSIS — E669 Obesity, unspecified: Secondary | ICD-10-CM

## 2023-05-21 DIAGNOSIS — E1169 Type 2 diabetes mellitus with other specified complication: Secondary | ICD-10-CM

## 2023-05-21 DIAGNOSIS — N401 Enlarged prostate with lower urinary tract symptoms: Secondary | ICD-10-CM | POA: Diagnosis not present

## 2023-05-21 DIAGNOSIS — R350 Frequency of micturition: Secondary | ICD-10-CM | POA: Diagnosis not present

## 2023-05-21 NOTE — Assessment & Plan Note (Signed)
On Ozempic, Crestor, ASA Demo was done

## 2023-05-21 NOTE — Assessment & Plan Note (Signed)
H/o elevated PSA.  Urol f/u

## 2023-05-21 NOTE — Progress Notes (Signed)
Subjective:  Patient ID: Drew Hansen, male    DOB: 1950/07/10  Age: 72 y.o. MRN: [id number]  CC: Medical Management of Chronic Issues (3 MNTH F/U)   HPI Drew Hansen presents for DM, HTN, ED  Outpatient Medications Prior to Visit  Medication Sig Dispense Refill   aspirin 81 MG EC tablet Take 1 tablet (81 mg total) by mouth daily. Swallow whole. 30 tablet 12   b complex vitamins tablet Take 1 tablet by mouth daily. 100 tablet 3   Blood Glucose Monitoring Suppl (ONETOUCH VERIO) w/Device KIT 1 Units by Does not apply route daily as needed. 1 kit 1   cholecalciferol (VITAMIN D) 1000 units tablet Take 2 tablets (2,000 Units total) by mouth daily. 100 tablet 5   fluticasone (FLONASE) 50 MCG/ACT nasal spray Place 2 sprays into both nostrils daily. 16 g 11   furosemide (LASIX) 20 MG tablet TAKE 1 TO 2 TABLETS(20 TO 40 MG) BY MOUTH DAILY AS NEEDED FOR EDEMA 180 tablet 0   glucose blood (ONETOUCH VERIO) test strip Use as instructed 50 each 11   hydrochlorothiazide (MICROZIDE) 12.5 MG capsule TAKE 1 CAPSULE(12.5 MG) BY MOUTH DAILY 90 capsule 2   ibuprofen (ADVIL,MOTRIN) 600 MG tablet Take 600 mg by mouth 2 (two) times daily as needed.       Lancets (ONETOUCH ULTRASOFT) lancets Use as instructed 100 each 12   loratadine (CLARITIN) 10 MG tablet Take 1 tablet (10 mg total) by mouth daily. 30 tablet 11   metFORMIN (GLUCOPHAGE) 500 MG tablet Take 1 tablet (500 mg total) by mouth 2 (two) times daily with a meal. 180 tablet 3   rosuvastatin (CRESTOR) 5 MG tablet Take 1 tablet (5 mg total) by mouth daily. 90 tablet 2   Semaglutide,0.25 or 0.5MG /DOS, (OZEMPIC, 0.25 OR 0.5 MG/DOSE,) 2 MG/3ML SOPN Use 0.25 mg weekly sq for 1 month, then 0.5 mg sq weekly 9 mL 1   tadalafil (CIALIS) 5 MG tablet TAKE ONE TABLET BY MOUTH ONCE A DAY 90 tablet 0   tamsulosin (FLOMAX) 0.4 MG CAPS capsule Take 1 capsule (0.4 mg total) by mouth daily. 90 capsule 2   tiZANidine (ZANAFLEX) 2 MG tablet Take 1-2 tablets (2-4  mg total) by mouth every 8 (eight) hours as needed. (Patient not taking: Reported on 05/21/2023) 60 tablet 1   No facility-administered medications prior to visit.    ROS: Review of Systems  Constitutional:  Negative for appetite change, fatigue and unexpected weight change.  HENT:  Negative for congestion, nosebleeds, sneezing, sore throat and trouble swallowing.   Eyes:  Negative for itching and visual disturbance.  Respiratory:  Negative for cough.   Cardiovascular:  Negative for chest pain, palpitations and leg swelling.  Gastrointestinal:  Negative for abdominal distention, blood in stool, diarrhea and nausea.  Genitourinary:  Negative for frequency and hematuria.  Musculoskeletal:  Negative for back pain, gait problem, joint swelling and neck pain.  Skin:  Negative for rash.  Neurological:  Negative for dizziness, tremors, speech difficulty and weakness.  Psychiatric/Behavioral:  Negative for agitation, dysphoric mood and sleep disturbance. The patient is not nervous/anxious.     Objective:  BP 120/70 (BP Location: Left Arm, Patient Position: Sitting, Cuff Size: Normal)   Pulse 88   Temp 98.1 F (36.7 C) (Oral)   Ht 5\' 8"  (1.727 m)   Wt 254 lb (115.2 kg)   SpO2 97%   BMI 38.62 kg/m   BP Readings from Last 3 Encounters:  05/21/23 120/70  04/25/23  130/86  04/09/23 (!) 149/87    Wt Readings from Last 3 Encounters:  05/21/23 254 lb (115.2 kg)  04/25/23 249 lb (112.9 kg)  03/14/23 253 lb (114.8 kg)    Physical Exam Constitutional:      General: He is not in acute distress.    Appearance: He is well-developed. He is obese.     Comments: NAD  Eyes:     Conjunctiva/sclera: Conjunctivae normal.     Pupils: Pupils are equal, round, and reactive to light.  Neck:     Thyroid: No thyromegaly.     Vascular: No JVD.  Cardiovascular:     Rate and Rhythm: Normal rate and regular rhythm.     Heart sounds: Normal heart sounds. No murmur heard.    No friction rub. No  gallop.  Pulmonary:     Effort: Pulmonary effort is normal. No respiratory distress.     Breath sounds: Normal breath sounds. No wheezing or rales.  Chest:     Chest wall: No tenderness.  Abdominal:     General: Bowel sounds are normal. There is no distension.     Palpations: Abdomen is soft. There is no mass.     Tenderness: There is no abdominal tenderness. There is no guarding or rebound.  Musculoskeletal:        General: No tenderness. Normal range of motion.     Cervical back: Normal range of motion.  Lymphadenopathy:     Cervical: No cervical adenopathy.  Skin:    General: Skin is warm and dry.     Findings: No rash.  Neurological:     Mental Status: He is alert and oriented to person, place, and time.     Cranial Nerves: No cranial nerve deficit.     Motor: No abnormal muscle tone.     Coordination: Coordination normal.     Gait: Gait normal.     Deep Tendon Reflexes: Reflexes are normal and symmetric.  Psychiatric:        Behavior: Behavior normal.        Thought Content: Thought content normal.        Judgment: Judgment normal.     Lab Results  Component Value Date   WBC 6.5 06/06/2022   HGB 15.2 06/06/2022   HCT 42.9 06/06/2022   PLT 174.0 06/06/2022   GLUCOSE 158 (H) 02/16/2023   CHOL 137 06/06/2022   TRIG 192.0 (H) 06/06/2022   HDL 35.30 (L) 06/06/2022   LDLDIRECT 102.0 06/20/2016   LDLCALC 64 06/06/2022   ALT 15 02/16/2023   AST 14 02/16/2023   NA 140 02/16/2023   K 3.9 02/16/2023   CL 103 02/16/2023   CREATININE 0.94 02/16/2023   BUN 13 02/16/2023   CO2 30 02/16/2023   TSH 3.80 06/06/2022   PSA 15.76 (H) 06/06/2022   HGBA1C 6.1 02/16/2023    DG INJECT DIAG/THERA/INC NEEDLE/CATH/PLC EPI/LUMB/SAC W/IMG Result Date: 04/09/2023 CLINICAL DATA:  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 Left-sided low back and leg pain. No prior surgery or injection. FLUOROSCOPY: Radiation Exposure Index (as provided by the fluoroscopic device):  11.7 mGy Kerma PROCEDURE: The procedure, risks, benefits, and alternatives were explained to the patient. Questions regarding the procedure were encouraged and answered. The patient understands and consents to the procedure. LUMBAR EPIDURAL INJECTION: An interlaminar approach was performed on the left at L4-L5. The overlying skin was cleansed and anesthetized. A 3.5 inch 20 gauge epidural needle was advanced using loss-of-resistance technique. DIAGNOSTIC EPIDURAL INJECTION: Injection of  Isovue-M 200 shows a good epidural pattern with spread above and below the level of needle placement, primarily on the left. No vascular opacification is seen. THERAPEUTIC EPIDURAL INJECTION: 80 mg of Depo-Medrol mixed with 3 mL of 1% lidocaine were instilled. The procedure was well-tolerated, and the patient was discharged thirty minutes following the injection in good condition. COMPLICATIONS: None immediate. IMPRESSION: Technically successful interlaminar epidural injection on the left at L4-L5. Electronically Signed   By: Obie Dredge M.D.   On: 04/09/2023 12:34    Assessment & Plan:   Problem List Items Addressed This Visit     BPH (benign prostatic hyperplasia) - Primary   H/o elevated PSA.  Urol f/u       Type 2 diabetes mellitus with obesity (HCC)   Planning to start Ozempic      Obesity (BMI 30-39.9)   Better - cont w/wt loss Wt Readings from Last 3 Encounters:  05/21/23 254 lb (115.2 kg)  04/25/23 249 lb (112.9 kg)  03/14/23 253 lb (114.8 kg)         CAD (coronary artery disease)   On Ozempic, Crestor, ASA Demo was done         No orders of the defined types were placed in this encounter.     Follow-up: Return in about 3 months (around 08/19/2023) for a follow-up visit.  Sonda Primes, MD

## 2023-05-21 NOTE — Assessment & Plan Note (Signed)
Planning to start Ozempic

## 2023-05-21 NOTE — Assessment & Plan Note (Signed)
Better - cont w/wt loss Wt Readings from Last 3 Encounters:  05/21/23 254 lb (115.2 kg)  04/25/23 249 lb (112.9 kg)  03/14/23 253 lb (114.8 kg)

## 2023-06-06 ENCOUNTER — Ambulatory Visit: Payer: Medicare PPO

## 2023-06-06 VITALS — BP 120/80 | HR 68 | Ht 68.0 in | Wt 255.4 lb

## 2023-06-06 DIAGNOSIS — Z0000 Encounter for general adult medical examination without abnormal findings: Secondary | ICD-10-CM

## 2023-06-06 DIAGNOSIS — E1169 Type 2 diabetes mellitus with other specified complication: Secondary | ICD-10-CM | POA: Diagnosis not present

## 2023-06-06 DIAGNOSIS — E669 Obesity, unspecified: Secondary | ICD-10-CM

## 2023-06-06 NOTE — Patient Instructions (Addendum)
 Mr. Drew Hansen , Thank you for taking time to come for your Medicare Wellness Visit. I appreciate your ongoing commitment to your health goals. Please review the following plan we discussed and let me know if I can assist you in the future.   Referrals/Orders/Follow-Ups/Clinician Recommendations: You are due for a tetanus vaccine.  Aim for 30 minutes of exercise or brisk walking, 6-8 glasses of water, and 5 servings of fruits and vegetables each day.   This is a list of the screening recommended for you and due dates:  Health Maintenance  Topic Date Due   Complete foot exam   Never done   Eye exam for diabetics  Never done   Yearly kidney health urinalysis for diabetes  Never done   Hepatitis C Screening  Never done   Zoster (Shingles) Vaccine (1 of 2) Never done   DTaP/Tdap/Td vaccine (2 - Td or Tdap) 10/16/2022   COVID-19 Vaccine (3 - 2024-25 season) 01/21/2023   Hemoglobin A1C  08/16/2023   Yearly kidney function blood test for diabetes  02/16/2024   Medicare Annual Wellness Visit  06/05/2024   Cologuard (Stool DNA test)  07/30/2024   Pneumonia Vaccine  Completed   HPV Vaccine  Aged Out   Flu Shot  Discontinued    Advanced directives: (Declined) Advance directive discussed with you today. Even though you declined this today, please call our office should you change your mind, and we can give you the proper paperwork for you to fill out.  Next Medicare Annual Wellness Visit scheduled for next year: Yes

## 2023-06-06 NOTE — Progress Notes (Addendum)
Subjective:   Drew Hansen is a 73 y.o. male who presents for Medicare Annual/Subsequent preventive examination.  Visit Complete: In person  Patient Medicare AWV questionnaire was completed by the patient on 06/02/23; I have confirmed that all information answered by patient is correct and no changes since this date.  Cardiac Risk Factors include: advanced age (>33men, >62 women);hypertension;male gender;diabetes mellitus;Other (see comment), Risk factor comments: CAD, Neuropathy, BPH     Objective:    Today's Vitals   06/06/23 1412  BP: 120/80  Pulse: 68  SpO2: 99%  Weight: 255 lb 6.4 oz (115.8 kg)  Height: 5\' 8"  (1.727 m)   Body mass index is 38.83 kg/m.     06/06/2023    2:14 PM 06/06/2022    3:53 PM  Advanced Directives  Does Patient Have a Medical Advance Directive? No No  Would patient like information on creating a medical advance directive? No - Patient declined No - Patient declined    Current Medications (verified) Outpatient Encounter Medications as of 06/06/2023  Medication Sig   aspirin 81 MG EC tablet Take 1 tablet (81 mg total) by mouth daily. Swallow whole.   b complex vitamins tablet Take 1 tablet by mouth daily.   Blood Glucose Monitoring Suppl (ONETOUCH VERIO) w/Device KIT 1 Units by Does not apply route daily as needed.   cholecalciferol (VITAMIN D) 1000 units tablet Take 2 tablets (2,000 Units total) by mouth daily.   fluticasone (FLONASE) 50 MCG/ACT nasal spray Place 2 sprays into both nostrils daily.   furosemide (LASIX) 20 MG tablet TAKE 1 TO 2 TABLETS(20 TO 40 MG) BY MOUTH DAILY AS NEEDED FOR EDEMA   glucose blood (ONETOUCH VERIO) test strip Use as instructed   hydrochlorothiazide (MICROZIDE) 12.5 MG capsule TAKE 1 CAPSULE(12.5 MG) BY MOUTH DAILY   ibuprofen (ADVIL,MOTRIN) 600 MG tablet Take 600 mg by mouth 2 (two) times daily as needed.     Lancets (ONETOUCH ULTRASOFT) lancets Use as instructed   loratadine (CLARITIN) 10 MG tablet Take 1  tablet (10 mg total) by mouth daily.   metFORMIN (GLUCOPHAGE) 500 MG tablet Take 1 tablet (500 mg total) by mouth 2 (two) times daily with a meal.   rosuvastatin (CRESTOR) 5 MG tablet Take 1 tablet (5 mg total) by mouth daily.   Semaglutide,0.25 or 0.5MG /DOS, (OZEMPIC, 0.25 OR 0.5 MG/DOSE,) 2 MG/3ML SOPN Use 0.25 mg weekly sq for 1 month, then 0.5 mg sq weekly   tadalafil (CIALIS) 5 MG tablet TAKE ONE TABLET BY MOUTH ONCE A DAY   tamsulosin (FLOMAX) 0.4 MG CAPS capsule Take 1 capsule (0.4 mg total) by mouth daily.   tiZANidine (ZANAFLEX) 2 MG tablet Take 1-2 tablets (2-4 mg total) by mouth every 8 (eight) hours as needed.   No facility-administered encounter medications on file as of 06/06/2023.    Allergies (verified) Ciprofloxacin   History: Past Medical History:  Diagnosis Date   BPH (benign prostatic hyperplasia) 2007   Elevated PSA, s/p bx Dr Vernie Ammons   Past Surgical History:  Procedure Laterality Date   PROSTATE BIOPSY     Dr Vernie Ammons - he did not go?   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  Tobacco Use   Smoking status: Former   Smokeless tobacco: Never  Advertising account planner   Vaping status: Never Used  Substance and Sexual Activity   Alcohol use: No   Drug use: No   Sexual activity: Yes  Other Topics Concern   Not on file  Social History Narrative   Regular exercise - yes - ping pong and Bikram yoga, walking   Lives with wife-2025   Social Drivers of Health   Financial Resource Strain: Low Risk  (06/06/2023)   Overall Financial Resource Strain (CARDIA)    Difficulty of Paying Living Expenses: Not hard at all  Food Insecurity: No Food  Insecurity (06/06/2023)   Hunger Vital Sign    Worried About Running Out of Food in the Last Year: Never true    Ran Out of Food in the Last Year: Never true  Transportation Needs: No Transportation Needs (06/06/2023)   PRAPARE - Administrator, Civil Service (Medical): No    Lack of Transportation (Non-Medical): No  Physical Activity: Sufficiently Active (06/06/2023)   Exercise Vital Sign    Days of Exercise per Week: 3 days    Minutes of Exercise per Session: 90 min  Stress: Stress Concern Present (06/06/2023)   [employer] of Occupational Health - Occupational Stress Questionnaire    Feeling of Stress : To some extent  Social Connections: Moderately Isolated (06/06/2023)   Social Connection and Isolation Panel [NHANES]    Frequency of Communication with Friends and Family: Twice a week    Frequency of Social Gatherings with Friends and Family: Once a week    Attends Religious Services: Never    Database administrator or Organizations: No    Attends Engineer, structural: Never    Marital Status: Married    Tobacco Counseling Counseling given: Not Answered   Clinical Intake:  Pre-visit preparation completed: Yes  Pain : No/denies pain     BMI - recorded: 38.83 Nutritional Status: BMI > 30  Obese Nutritional Risks: None Diabetes: Yes CBG done?: No Did pt. bring in CBG monitor from home?: No  How often do you need to have someone help you when you read instructions, pamphlets, or other written materials from your doctor or pharmacy?: 1 - Never  Interpreter Needed?: No  Information entered by :: Laronda Lisby, RMA   Activities of Daily Living    06/02/2023    6:31 PM  In your present state of health, do you have any difficulty performing the following activities:  Hearing? 0  Vision? 0  Difficulty concentrating or making decisions? 0  Walking or climbing stairs? 0  Dressing or bathing? 0  Doing errands, shopping? 0  Preparing Food and  eating ? N  Using the Toilet? N  In the past six months, have you accidently leaked urine? N  Do you have problems with loss of bowel control? N  Managing your Medications? N  Managing your Finances? N  Housekeeping or managing your Housekeeping? N    Patient Care Team: Plotnikov, Georgina Quint, MD as PCP - General Meroth, Ruby Cola, AUD as Consulting Physician (Audiology)  Indicate any recent Medical Services you may have received from other than [facility] providers in the past year (date may be approximate).     Assessment:   This is a routine wellness examination for [place].  Hearing/Vision screen Hearing Screening -  Comments:: Has hearing aides Vision Screening - Comments:: Denies vision issues.    Goals Addressed             This Visit's Progress    Client understands the importance of follow-up with providers by attending scheduled visits.   On track     Depression Screen    06/06/2023    1:59 PM 05/21/2023    8:43 AM 09/12/2022    2:48 PM 06/08/2022    8:33 AM 06/06/2022    3:58 PM 04/25/2021    2:23 PM 10/21/2018    9:19 AM  PHQ 2/9 Scores  PHQ - 2 Score 0 0 0 0 0 0 0  PHQ- 9 Score 0  0   1     Fall Risk    06/02/2023    6:31 PM 05/21/2023    8:43 AM 09/12/2022    2:48 PM 06/08/2022    8:32 AM 06/06/2022    3:53 PM  Fall Risk   Falls in the past year? 0 0 0 0 0  Number falls in past yr: 0 0 0 0 0  Injury with Fall? 0 0 0 0 0  Risk for fall due to :  No Fall Risks  No Fall Risks No Fall Risks  Follow up Falls evaluation completed;Falls prevention discussed Falls evaluation completed  Falls evaluation completed Falls prevention discussed    MEDICARE RISK AT HOME: Medicare Risk at Home Any stairs in or around the home?: (Patient-Rptd) No  TIMED UP AND GO:  Was the test performed?  Yes  Length of time to ambulate 10 feet: 12 sec Gait steady and fast without use of assistive device    Cognitive Function:        06/06/2023    1:41 PM 06/06/2022    3:58 PM   6CIT Screen  What Year? 0 points 0 points  What month? 0 points 0 points  What time? 0 points 0 points  Count back from 20 0 points 0 points  Months in reverse 0 points 0 points  Repeat phrase 0 points 0 points  Total Score 0 points 0 points    Immunizations Immunization History  Administered Date(s) Administered   Fluad Quad(high Dose 65+) 03/03/2019   Influenza, High Dose Seasonal PF 04/04/2018   PFIZER(Purple Top)SARS-COV-2 Vaccination 07/12/2019, 08/05/2019   Pneumococcal Conjugate-13 06/22/2016   Pneumococcal Polysaccharide-23 10/03/2017   Tdap 10/15/2012    TDAP status: Due, Education has been provided regarding the importance of this vaccine. Advised may receive this vaccine at local pharmacy or Health Dept. Aware to provide a copy of the vaccination record if obtained from local pharmacy or Health Dept. Verbalized acceptance and understanding.  Flu Vaccine status: Declined, Education has been provided regarding the importance of this vaccine but patient still declined. Advised may receive this vaccine at local pharmacy or Health Dept. Aware to provide a copy of the vaccination record if obtained from local pharmacy or Health Dept. Verbalized acceptance and understanding.  Pneumococcal vaccine status: Up to date  Covid-19 vaccine status: Declined, Education has been provided regarding the importance of this vaccine but patient still declined. Advised may receive this vaccine at local pharmacy or Health Dept.or vaccine clinic. Aware to provide a copy of the vaccination record if obtained from local pharmacy or Health Dept. Verbalized acceptance and understanding.  Qualifies for Shingles Vaccine? Yes   Zostavax completed No   Shingrix Completed?: No.    Education has been provided regarding the importance of  this vaccine. Patient has been advised to call insurance company to determine out of pocket expense if they have not yet received this vaccine. Advised may also receive  vaccine at local pharmacy or Health Dept. Verbalized acceptance and understanding.  Screening Tests Health Maintenance  Topic Date Due   Diabetic kidney evaluation - Urine ACR  Never done   COVID-19 Vaccine (3 - 2024-25 season) 06/22/2023 (Originally 01/21/2023)   Zoster Vaccines- Shingrix (1 of 2) 09/04/2023 (Originally 05/27/2000)   DTaP/Tdap/Td (2 - Td or Tdap) 09/19/2023 (Originally 10/16/2022)   FOOT EXAM  12/20/2023 (Originally 05/27/1960)   OPHTHALMOLOGY EXAM  12/20/2023 (Originally 05/27/1960)   Hepatitis C Screening  12/20/2023 (Originally 05/27/1968)   HEMOGLOBIN A1C  08/16/2023   Diabetic kidney evaluation - eGFR measurement  02/16/2024   Medicare Annual Wellness (AWV)  06/05/2024   Fecal DNA (Cologuard)  07/30/2024   Pneumonia Vaccine 65+ Years old  Completed   HPV VACCINES  Aged Out   INFLUENZA VACCINE  Discontinued    Health Maintenance  Health Maintenance Due  Topic Date Due   Diabetic kidney evaluation - Urine ACR  Never done    Colorectal cancer screening: Type of screening: Cologuard. Completed 07/30/2021. Repeat every 3 years  Lung Cancer Screening: (Low Dose CT Chest recommended if Age 5-80 years, 20 pack-year currently smoking OR have quit w/in 15years.) does not qualify.   Lung Cancer Screening Referral: N/A  Additional Screening:  Hepatitis C Screening: does qualify;   Vision Screening: Recommended annual ophthalmology exams for early detection of glaucoma and other disorders of the eye. Is the patient up to date with their annual eye exam?  No  Who is the provider or what is the name of the office in which the patient attends annual eye exams? N/A If pt is not established with a provider, would they like to be referred to a provider to establish care? Yes .   Dental Screening: Recommended annual dental exams for proper oral hygiene  Diabetic Foot Exam: Diabetic Foot Exam: Overdue, Pt has been advised about the importance in completing this exam. Pt is  scheduled for diabetic foot exam on next office visit.  Community Resource Referral / Chronic Care Management: CRR required this visit?  No   CCM required this visit?  No     Plan:     I have personally reviewed and noted the following in the patient's chart:   Medical and social history Use of alcohol, tobacco or illicit drugs  Current medications and supplements including opioid prescriptions. Patient is not currently taking opioid prescriptions. Functional ability and status Nutritional status Physical activity Advanced directives List of other physicians Hospitalizations, surgeries, and ER visits in previous 12 months Vitals Screenings to include cognitive, depression, and falls Referrals and appointments  In addition, I have reviewed and discussed with patient certain preventive protocols, quality metrics, and best practice recommendations. A written personalized care plan for preventive services as well as general preventive health recommendations were provided to patient.     Vivien Barretto L Josephanthony Tindel, CMA   06/06/2023   After Visit Summary: (MyChart) Due to this being a telephonic visit, the after visit summary with patients personalized plan was offered to patient via MyChart   Nurse Notes: Patient is due for a foot exam, an eye exam, UACR and a Hep C screening.  He declines the Covid and Shingles vaccines.  Patient does need a referral for an eye doctor.  He did not understand why he had to have  this visit, which  tried to explain to him that this was a medicare wellness and it is not jus to for the elderly but all ages of patient who now have medicare.     Medical screening examination/treatment/procedure(s) were performed by non-physician practitioner and as supervising physician I was immediately available for consultation/collaboration.  I agree with above. Jacinta Shoe, MD

## 2023-06-11 ENCOUNTER — Encounter: Payer: Self-pay | Admitting: Internal Medicine

## 2023-06-17 ENCOUNTER — Other Ambulatory Visit: Payer: Self-pay | Admitting: Internal Medicine

## 2023-06-17 MED ORDER — SEMAGLUTIDE (1 MG/DOSE) 4 MG/3ML ~~LOC~~ SOPN: 1.0000 mg | PEN_INJECTOR | SUBCUTANEOUS | 3 refills | Status: DC

## 2023-06-20 IMAGING — MR MR PROSTATE WO/W CM
12 series · 48 of 48 positions shown · IV contrast (multihance)
Comparison: None.

CLINICAL DATA: Elevated PSA.  Atypical glands on prior biopsy.

EXAM:
MR PROSTATE WITHOUT AND WITH CONTRAST
TECHNIQUE: Multiplanar multisequence MRI images were obtained of the pelvis
centered about the prostate. Pre and post contrast images were
obtained.
CONTRAST:  20mL MULTIHANCE GADOBENATE DIMEGLUMINE 529 MG/ML IV SOLN

[Series 3: T2 · coronal · 3.0mm · 0.56mm/px · 1 of 29 slices shown (1 of 3)]
[im 1/29]
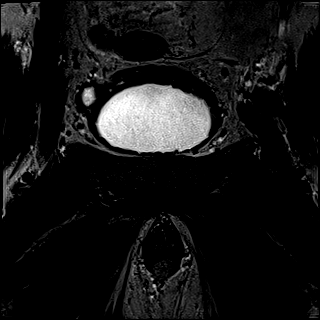

[Series 4: T1 · axial · 5.0mm · 1.25mm/px · 1 of 104 slices shown]
[im 1/104]
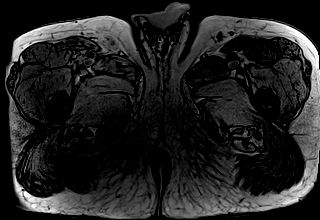

[Series 5: DWI · axial · 3.0mm · 1.75mm/px · z∈[-53,+46]mm · 2 of 102 slices shown (1 of 3)]
[im 1/102]
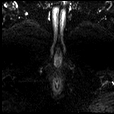
[im 102/102]
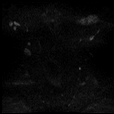

[Series 6: DWI · axial · 3.0mm · 1.75mm/px · 1 of 34 slices shown (2 of 3)]
[im 1/34]
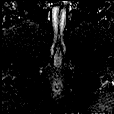

[Series 7: DWI · axial · 3.0mm · 1.75mm/px · 1 of 34 slices shown (3 of 3)]
[im 1/34]
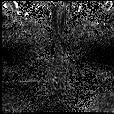

[Series 8: T2 · axial · 3.0mm · 0.56mm/px · 1 of 34 slices shown (2 of 3)]
[im 1/34]
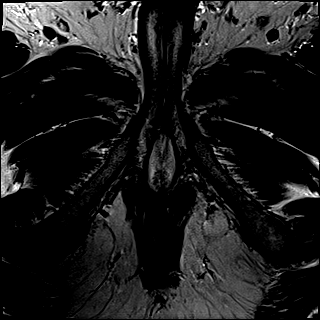

[Series 9: T2 · axial · 1.0mm · 1.04mm/px · z∈[-47,+40]mm · 2 of 88 slices shown (3 of 3)]
[im 1/88]
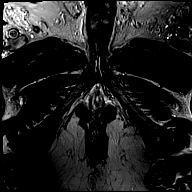
[im 88/88]
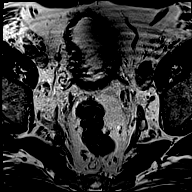

[Series 10: pre t1_twist_tra_dyn · axial · non-contrast · 3.5mm · 0.83mm/px · 1 of 26 slices shown]
[im 1/26]
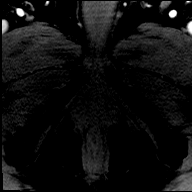

[Series 11: post t1_twist_tra_dyn-copy center · axial · non-contrast · 3.5mm · 0.83mm/px · z∈[-47,+40]mm · 17 of 780 slices shown]
[im 1/780]
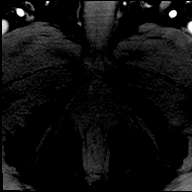
[im 49/780]
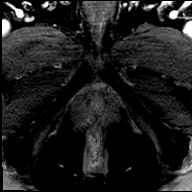
[im 98/780]
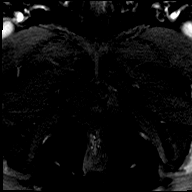
[im 147/780]
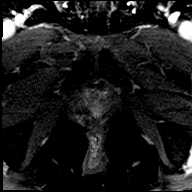
[im 195/780]
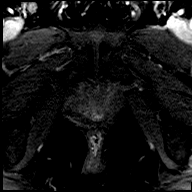
[im 244/780]
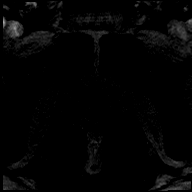
[im 293/780]
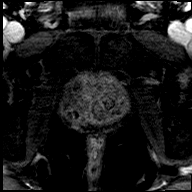
[im 341/780]
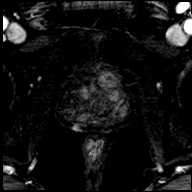
[im 390/780]
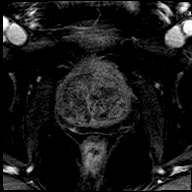
[im 439/780]
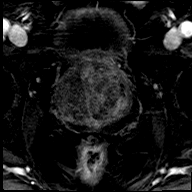
[im 487/780]
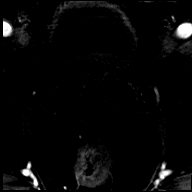
[im 536/780]
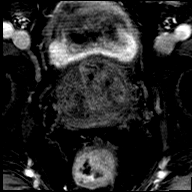
[im 585/780]
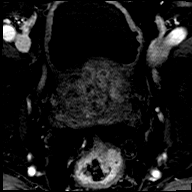
[im 633/780]
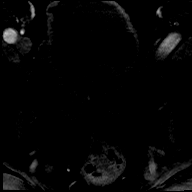
[im 682/780]
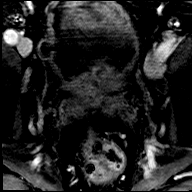
[im 731/780]
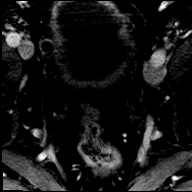
[im 780/780]
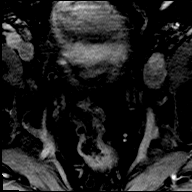

[Series 12: post t1_twist_tra_dyn-copy cent_sub · axial · 3.5mm · 0.83mm/px · z∈[-47,+40]mm · 17 of 751 slices shown]
[im 1/751]
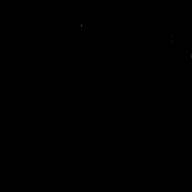
[im 47/751]
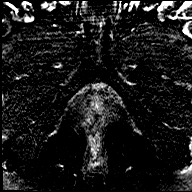
[im 94/751]
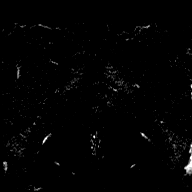
[im 141/751]
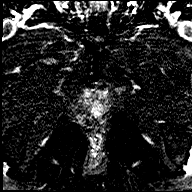
[im 188/751]
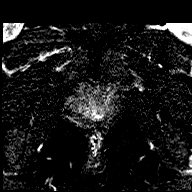
[im 235/751]
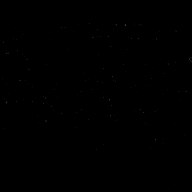
[im 282/751]
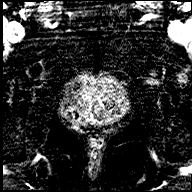
[im 329/751]
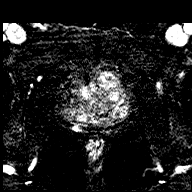
[im 376/751]
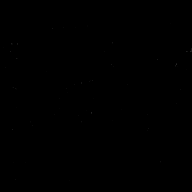
[im 422/751]
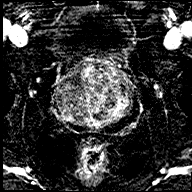
[im 469/751]
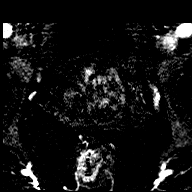
[im 516/751]
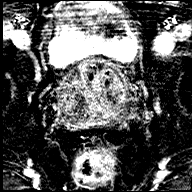
[im 563/751]
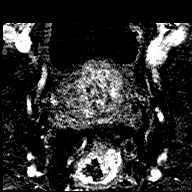
[im 610/751]
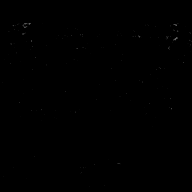
[im 657/751]
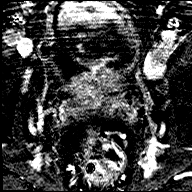
[im 704/751]
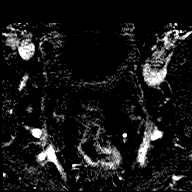
[im 751/751]
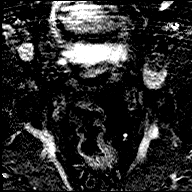

[Series 13: t1_vibe_dixon_tra_f · axial · 2.5mm · 0.91mm/px · z∈[-63,+134]mm · 2 of 80 slices shown]
[im 1/80]
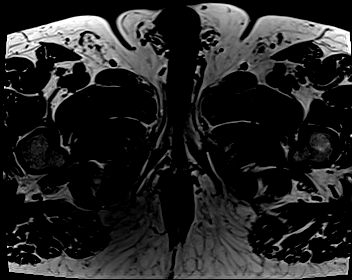
[im 80/80]
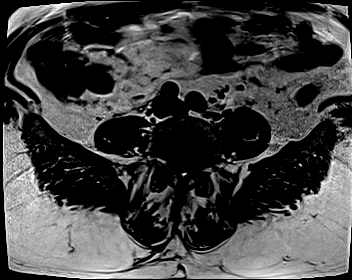

[Series 14: t1_vibe_dixon_tra_w · axial · 2.5mm · 0.91mm/px · z∈[-63,+134]mm · 2 of 80 slices shown]
[im 1/80]
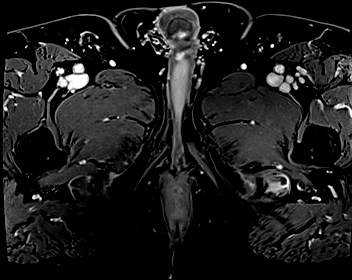
[im 80/80]
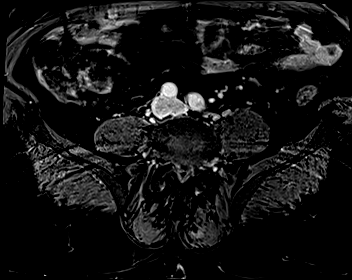

[48 of 48 positions shown; findings below may reference images not displayed]

FINDINGS: Prostate:

-- Peripheral Zone: A 6 mm T2 hypointense nodule is seen in the
right posteromedial mid gland, which has well-circumscribed margins
this measures 6 mm on image 50/9. This nodule shows moderate ADC
hypointensity, minimal DWI hyperintensity, and early focal contrast
enhancement. PI-RADS 4

-- Transition/Central Zone: Moderate to markedly enlarged with
involvement by BPH nodules. No nodules with suspicious
characteristics on T2-weighted or diffusion imaging.

-- Measurements/Volume:  6.3 by 6.0 x 7.1 cm (volume = 140 cm^3)

Transcapsular spread:  Absent

Seminal vesicle involvement:  Absent

Neurovascular bundle involvement:  Absent

Pelvic adenopathy: 9 mm right pelvic sidewall lymph node seen just
below the iliac bifurcation. No pathologically enlarged lymph nodes
identified.

Bone metastasis: None visualized

Other: Diffuse bladder wall thickening with multiple small
diverticula, consistent with chronic bladder outlet obstruction.
IMPRESSION: 6 mm peripheral zone nodule in the right posteromedial mid gland,
suspicious for high-grade carcinoma. PI-RADS 4 (v2.1): High
(clinically significant cancer likely)

Nonspecific 9 mm right pelvic sidewall lymph node just below the
iliac bifurcation. No pathologically enlarged lymph nodes
identified.

(I have post-processed this exam in the DynaCAD application for
potential fusion-guided biopsy.)

## 2023-08-02 ENCOUNTER — Encounter: Payer: Self-pay | Admitting: Internal Medicine

## 2023-08-05 ENCOUNTER — Other Ambulatory Visit: Payer: Self-pay | Admitting: Internal Medicine

## 2023-08-08 ENCOUNTER — Other Ambulatory Visit: Payer: Self-pay | Admitting: Internal Medicine

## 2023-08-08 ENCOUNTER — Encounter: Payer: Self-pay | Admitting: Internal Medicine

## 2023-08-08 MED ORDER — SEMAGLUTIDE (2 MG/DOSE) 8 MG/3ML ~~LOC~~ SOPN: 2.0000 mg | PEN_INJECTOR | SUBCUTANEOUS | 3 refills | Status: DC

## 2023-08-11 ENCOUNTER — Other Ambulatory Visit: Payer: Self-pay | Admitting: Internal Medicine

## 2023-08-23 ENCOUNTER — Encounter: Payer: Self-pay | Admitting: Internal Medicine

## 2023-08-28 ENCOUNTER — Encounter: Payer: Self-pay | Admitting: Internal Medicine

## 2023-09-03 NOTE — Telephone Encounter (Signed)
Being addressed in separate note.

## 2023-09-04 ENCOUNTER — Other Ambulatory Visit: Payer: Self-pay

## 2023-09-04 DIAGNOSIS — E1169 Type 2 diabetes mellitus with other specified complication: Secondary | ICD-10-CM

## 2023-09-04 MED ORDER — SEMAGLUTIDE (2 MG/DOSE) 8 MG/3ML ~~LOC~~ SOPN: 2.0000 mg | PEN_INJECTOR | SUBCUTANEOUS | 3 refills | Status: DC

## 2023-09-18 ENCOUNTER — Ambulatory Visit: Admitting: Internal Medicine

## 2023-09-18 ENCOUNTER — Telehealth: Payer: Self-pay

## 2023-09-18 ENCOUNTER — Encounter: Payer: Self-pay | Admitting: Internal Medicine

## 2023-09-18 ENCOUNTER — Other Ambulatory Visit (HOSPITAL_COMMUNITY): Payer: Self-pay

## 2023-09-18 ENCOUNTER — Other Ambulatory Visit (INDEPENDENT_AMBULATORY_CARE_PROVIDER_SITE_OTHER)

## 2023-09-18 VITALS — BP 118/68 | HR 77 | Temp 97.4°F | Ht 68.0 in | Wt 249.2 lb

## 2023-09-18 DIAGNOSIS — E1169 Type 2 diabetes mellitus with other specified complication: Secondary | ICD-10-CM

## 2023-09-18 DIAGNOSIS — I10 Essential (primary) hypertension: Secondary | ICD-10-CM

## 2023-09-18 DIAGNOSIS — E669 Obesity, unspecified: Secondary | ICD-10-CM

## 2023-09-18 DIAGNOSIS — M2550 Pain in unspecified joint: Secondary | ICD-10-CM | POA: Insufficient documentation

## 2023-09-18 DIAGNOSIS — I2583 Coronary atherosclerosis due to lipid rich plaque: Secondary | ICD-10-CM | POA: Diagnosis not present

## 2023-09-18 DIAGNOSIS — Z7985 Long-term (current) use of injectable non-insulin antidiabetic drugs: Secondary | ICD-10-CM | POA: Diagnosis not present

## 2023-09-18 DIAGNOSIS — Z7984 Long term (current) use of oral hypoglycemic drugs: Secondary | ICD-10-CM

## 2023-09-18 DIAGNOSIS — I2510 Atherosclerotic heart disease of native coronary artery without angina pectoris: Secondary | ICD-10-CM | POA: Diagnosis not present

## 2023-09-18 DIAGNOSIS — G629 Polyneuropathy, unspecified: Secondary | ICD-10-CM

## 2023-09-18 LAB — COMPREHENSIVE METABOLIC PANEL WITH GFR
ALT: 22 U/L (ref 0–53)
AST: 17 U/L (ref 0–37)
Albumin: 4.5 g/dL (ref 3.5–5.2)
Alkaline Phosphatase: 44 U/L (ref 39–117)
BUN: 19 mg/dL (ref 6–23)
CO2: 30 meq/L (ref 19–32)
Calcium: 9.8 mg/dL (ref 8.4–10.5)
Chloride: 103 meq/L (ref 96–112)
Creatinine, Ser: 1.01 mg/dL (ref 0.40–1.50)
GFR: 73.88 mL/min (ref >60.00)
Glucose, Bld: 133 mg/dL — ABNORMAL HIGH (ref 70–99)
Potassium: 4.3 meq/L (ref 3.5–5.1)
Sodium: 140 meq/L (ref 135–145)
Total Bilirubin: 0.9 mg/dL (ref 0.2–1.2)
Total Protein: 7 g/dL (ref 6.0–8.3)

## 2023-09-18 LAB — URINALYSIS
Bilirubin Urine: NEGATIVE
Hgb urine dipstick: NEGATIVE
Ketones, ur: NEGATIVE
Leukocytes,Ua: NEGATIVE
Nitrite: NEGATIVE
Specific Gravity, Urine: >=1.03 — AB (ref 1.000–1.030)
Urine Glucose: NEGATIVE
Urobilinogen, UA: 0.2 (ref 0.0–1.0)
pH: 6 (ref 5.0–8.0)

## 2023-09-18 LAB — CBC WITH DIFFERENTIAL/PLATELET
Basophils Absolute: 0 10*3/uL (ref 0.0–0.1)
Basophils Relative: 0.5 % (ref 0.0–3.0)
Eosinophils Absolute: 0.1 10*3/uL (ref 0.0–0.7)
Eosinophils Relative: 1.9 % (ref 0.0–5.0)
HCT: 42.3 % (ref 39.0–52.0)
Hemoglobin: 15.1 g/dL (ref 13.0–17.0)
Lymphocytes Relative: 29.4 % (ref 12.0–46.0)
Lymphs Abs: 1.9 10*3/uL (ref 0.7–4.0)
MCHC: 35.7 g/dL (ref 30.0–36.0)
MCV: 86.4 fl (ref 78.0–100.0)
Monocytes Absolute: 0.4 10*3/uL (ref 0.1–1.0)
Monocytes Relative: 6.5 % (ref 3.0–12.0)
Neutro Abs: 3.9 10*3/uL (ref 1.4–7.7)
Neutrophils Relative %: 61.7 % (ref 43.0–77.0)
Platelets: 168 10*3/uL (ref 150.0–400.0)
RBC: 4.9 Mil/uL (ref 4.22–5.81)
RDW: 14.1 % (ref 11.5–15.5)
WBC: 6.4 10*3/uL (ref 4.0–10.5)

## 2023-09-18 LAB — TSH: TSH: 1.83 u[IU]/mL (ref 0.35–5.50)

## 2023-09-18 LAB — PSA: PSA: 16.27 ng/mL — ABNORMAL HIGH (ref 0.10–4.00)

## 2023-09-18 LAB — HEMOGLOBIN A1C: Hgb A1c MFr Bld: 5.9 % (ref 4.6–6.5)

## 2023-09-18 MED ORDER — SEMAGLUTIDE (2 MG/DOSE) 8 MG/3ML ~~LOC~~ SOPN: 2.0000 mg | PEN_INJECTOR | SUBCUTANEOUS | 3 refills | Status: AC

## 2023-09-18 MED ORDER — HYDROCHLOROTHIAZIDE 12.5 MG PO CAPS: ORAL_CAPSULE | ORAL | 3 refills | Status: AC

## 2023-09-18 MED ORDER — FUROSEMIDE 20 MG PO TABS: ORAL_TABLET | ORAL | 3 refills | Status: AC

## 2023-09-18 MED ORDER — ROSUVASTATIN CALCIUM 5 MG PO TABS: ORAL_TABLET | ORAL | 3 refills | Status: DC

## 2023-09-18 MED ORDER — METFORMIN HCL 500 MG PO TABS: ORAL_TABLET | ORAL | 3 refills | Status: AC

## 2023-09-18 MED ORDER — TAMSULOSIN HCL 0.4 MG PO CAPS: ORAL_CAPSULE | ORAL | 3 refills | Status: AC

## 2023-09-18 MED ORDER — TADALAFIL 5 MG PO TABS: 5.0000 mg | ORAL_TABLET | Freq: Every day | ORAL | 3 refills | Status: AC

## 2023-09-18 NOTE — Assessment & Plan Note (Signed)
Better on Ozempic

## 2023-09-18 NOTE — Assessment & Plan Note (Signed)
Cont on HCTZ 

## 2023-09-18 NOTE — Progress Notes (Signed)
 Subjective:  Patient ID: Drew Hansen, male    DOB: [birth year]  Age: 73 y.o. MRN: [id number]  CC: Diabetes (Patient notes of recent bouts of body aches, worse to the touch and when cold. When pain is at its worst patient experiences diarrhea. Has been following up with Dr.Coda Mathey via MyChart. Patient has not stopped the ozempic  yet but will do this next week to see if this stops symptoms. Believes this might have coincided with med dosage increase. Would like most recent labs reviewed)   HPI Drew Hansen presents for chills, aches, nausea x 2 months ?due to  F/u on DM2  Outpatient Medications Prior to Visit  Medication Sig Dispense Refill   aspirin  81 MG EC tablet Take 1 tablet (81 mg total) by mouth daily. Swallow whole. 30 tablet 12   b complex vitamins tablet Take 1 tablet by mouth daily. 100 tablet 3   Blood Glucose Monitoring Suppl (ONETOUCH VERIO) w/Device KIT 1 Units by Does not apply route daily as needed. 1 kit 1   cholecalciferol (VITAMIN D ) 1000 units tablet Take 2 tablets (2,000 Units total) by mouth daily. 100 tablet 5   fluticasone  (FLONASE ) 50 MCG/ACT nasal spray Place 2 sprays into both nostrils daily. 16 g 11   glucose blood (ONETOUCH VERIO) test strip Use as instructed 50 each 11   ibuprofen (ADVIL,MOTRIN) 600 MG tablet Take 600 mg by mouth 2 (two) times daily as needed.       Lancets (ONETOUCH ULTRASOFT) lancets Use as instructed 100 each 12   loratadine  (CLARITIN ) 10 MG tablet Take 1 tablet (10 mg total) by mouth daily. 30 tablet 11   tiZANidine  (ZANAFLEX ) 2 MG tablet Take 1-2 tablets (2-4 mg total) by mouth every 8 (eight) hours as needed. 60 tablet 1   furosemide  (LASIX ) 20 MG tablet TAKE 1 TO 2 TABLETS(20 TO 40 MG) BY MOUTH DAILY AS NEEDED FOR EDEMA 180 tablet 0   hydrochlorothiazide  (MICROZIDE ) 12.5 MG capsule TAKE 1 CAPSULE(12.5 MG) BY MOUTH DAILY 90 capsule 2   metFORMIN  (GLUCOPHAGE ) 500 MG tablet TAKE 1 TABLET(500 MG) BY MOUTH TWICE DAILY WITH A MEAL  180 tablet 3   rosuvastatin  (CRESTOR ) 5 MG tablet TAKE 1 TABLET(5 MG) BY MOUTH DAILY 90 tablet 2   Semaglutide , 2 MG/DOSE, 8 MG/3ML SOPN Inject 2 mg as directed once a week. 3 mL 3   tadalafil  (CIALIS ) 5 MG tablet TAKE ONE TABLET BY MOUTH ONCE A DAY 90 tablet 1   tamsulosin  (FLOMAX ) 0.4 MG CAPS capsule TAKE 1 CAPSULE(0.4 MG) BY MOUTH DAILY 90 capsule 2   No facility-administered medications prior to visit.    ROS: Review of Systems  Constitutional:  Positive for chills. Negative for appetite change, fatigue and unexpected weight change.  HENT:  Negative for congestion, nosebleeds, sneezing, sore throat and trouble swallowing.   Eyes:  Negative for itching and visual disturbance.  Respiratory:  Negative for cough.   Cardiovascular:  Negative for chest pain, palpitations and leg swelling.  Gastrointestinal:  Negative for abdominal distention, blood in stool, diarrhea and nausea.  Genitourinary:  Negative for frequency and hematuria.  Musculoskeletal:  Positive for arthralgias. Negative for back pain, gait problem, joint swelling and neck pain.  Skin:  Negative for rash.  Neurological:  Negative for dizziness, tremors, speech difficulty and weakness.  Psychiatric/Behavioral:  Negative for agitation, dysphoric mood and sleep disturbance. The patient is not nervous/anxious.     Objective:  BP 118/68   Pulse 77   Temp (!)  97.4 F (36.3 C)   Ht 5\' 8"  (1.727 m)   Wt 249 lb 3.2 oz (113 kg)   SpO2 94%   BMI 37.89 kg/m   BP Readings from Last 3 Encounters:  09/18/23 118/68  06/06/23 120/80  05/21/23 120/70    Wt Readings from Last 3 Encounters:  09/18/23 249 lb 3.2 oz (113 kg)  06/06/23 255 lb 6.4 oz (115.8 kg)  05/21/23 254 lb (115.2 kg)    Physical Exam Constitutional:      General: He is not in acute distress.    Appearance: He is well-developed. He is obese.     Comments: NAD  Eyes:     Conjunctiva/sclera: Conjunctivae normal.     Pupils: Pupils are equal, round, and  reactive to light.  Neck:     Thyroid : No thyromegaly.     Vascular: No JVD.  Cardiovascular:     Rate and Rhythm: Normal rate and regular rhythm.     Heart sounds: Normal heart sounds. No murmur heard.    No friction rub. No gallop.  Pulmonary:     Effort: Pulmonary effort is normal. No respiratory distress.     Breath sounds: Normal breath sounds. No wheezing or rales.  Chest:     Chest wall: No tenderness.  Abdominal:     General: Bowel sounds are normal. There is no distension.     Palpations: Abdomen is soft. There is no mass.     Tenderness: There is no abdominal tenderness. There is no guarding or rebound.  Musculoskeletal:        General: No tenderness. Normal range of motion.     Cervical back: Normal range of motion.  Lymphadenopathy:     Cervical: No cervical adenopathy.  Skin:    General: Skin is warm and dry.     Findings: No rash.  Neurological:     Mental Status: He is alert and oriented to person, place, and time.     Cranial Nerves: No cranial nerve deficit.     Motor: No abnormal muscle tone.     Coordination: Coordination normal.     Gait: Gait normal.     Deep Tendon Reflexes: Reflexes are normal and symmetric.  Psychiatric:        Behavior: Behavior normal.        Thought Content: Thought content normal.        Judgment: Judgment normal.     Lab Results  Component Value Date   WBC 6.4 09/18/2023   HGB 15.1 09/18/2023   HCT 42.3 09/18/2023   PLT 168.0 09/18/2023   GLUCOSE 133 (H) 09/18/2023   CHOL 137 06/06/2022   TRIG 192.0 (H) 06/06/2022   HDL 35.30 (L) 06/06/2022   LDLDIRECT 102.0 06/20/2016   LDLCALC 64 06/06/2022   ALT 22 09/18/2023   AST 17 09/18/2023   NA 140 09/18/2023   K 4.3 09/18/2023   CL 103 09/18/2023   CREATININE 1.01 09/18/2023   BUN 19 09/18/2023   CO2 30 09/18/2023   TSH 1.83 09/18/2023   PSA 16.27 (H) 09/18/2023   HGBA1C 5.9 09/18/2023    DG INJECT DIAG/THERA/INC NEEDLE/CATH/PLC EPI/LUMB/SAC W/IMG Result Date:  04/09/2023 CLINICAL DATA:  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 Left-sided low back and leg pain. No prior surgery or injection. FLUOROSCOPY: Radiation Exposure Index (as provided by the fluoroscopic device): 11.7 mGy Kerma PROCEDURE: The procedure, risks, benefits, and alternatives were explained to the patient. Questions regarding the procedure were encouraged and answered.  The patient understands and consents to the procedure. LUMBAR EPIDURAL INJECTION: An interlaminar approach was performed on the left at L4-L5. The overlying skin was cleansed and anesthetized. A 3.5 inch 20 gauge epidural needle was advanced using loss-of-resistance technique. DIAGNOSTIC EPIDURAL INJECTION: Injection of Isovue -M 200 shows a good epidural pattern with spread above and below the level of needle placement, primarily on the left. No vascular opacification is seen. THERAPEUTIC EPIDURAL INJECTION: 80 mg of Depo-Medrol  mixed with 3 mL of 1% lidocaine were instilled. The procedure was well-tolerated, and the patient was discharged thirty minutes following the injection in good condition. COMPLICATIONS: None immediate. IMPRESSION: Technically successful interlaminar epidural injection on the left at L4-L5. Electronically Signed   By: Aleta Anda M.D.   On: 04/09/2023 12:34    Assessment & Plan:   Problem List Items Addressed This Visit     HTN (hypertension)   Cont on HCTZ      Relevant Medications   rosuvastatin  (CRESTOR ) 5 MG tablet   tadalafil  (CIALIS ) 5 MG tablet   furosemide  (LASIX ) 20 MG tablet   hydrochlorothiazide  (MICROZIDE ) 12.5 MG capsule   Type 2 diabetes mellitus with obesity (HCC) - Primary   On Ozempic  Metformin  500 mg bid      Relevant Medications   metFORMIN  (GLUCOPHAGE ) 500 MG tablet   rosuvastatin  (CRESTOR ) 5 MG tablet   Semaglutide , 2 MG/DOSE, 8 MG/3ML SOPN   Other Relevant Orders   Comprehensive metabolic panel with GFR   Hemoglobin A1c   Obesity (BMI  30-39.9)   Better on Ozempic       Relevant Medications   metFORMIN  (GLUCOPHAGE ) 500 MG tablet   Semaglutide , 2 MG/DOSE, 8 MG/3ML SOPN   CAD (coronary artery disease)   On Ozempic , Crestor , ASA       Relevant Medications   rosuvastatin  (CRESTOR ) 5 MG tablet   tadalafil  (CIALIS ) 5 MG tablet   furosemide  (LASIX ) 20 MG tablet   hydrochlorothiazide  (MICROZIDE ) 12.5 MG capsule   Arthralgia   Due to Ozempic  or Crestor  Hold Ozempic  for 1 wk or hold Rosuvastatin  x 1 week         Meds ordered this encounter  Medications   metFORMIN  (GLUCOPHAGE ) 500 MG tablet    Sig: TAKE 1 TABLET(500 MG) BY MOUTH TWICE DAILY WITH A MEAL    Dispense:  180 tablet    Refill:  3   rosuvastatin  (CRESTOR ) 5 MG tablet    Sig: TAKE 1 TABLET(5 MG) BY MOUTH DAILY    Dispense:  90 tablet    Refill:  3   Semaglutide , 2 MG/DOSE, 8 MG/3ML SOPN    Sig: Inject 2 mg as directed once a week.    Dispense:  9 mL    Refill:  3   tadalafil  (CIALIS ) 5 MG tablet    Sig: Take 1 tablet (5 mg total) by mouth daily.    Dispense:  90 tablet    Refill:  3   tamsulosin  (FLOMAX ) 0.4 MG CAPS capsule    Sig: TAKE 1 CAPSULE(0.4 MG) BY MOUTH DAILY    Dispense:  90 capsule    Refill:  3   furosemide  (LASIX ) 20 MG tablet    Sig: TAKE 1 TO 2 TABLETS(20 TO 40 MG) BY MOUTH DAILY AS NEEDED FOR EDEMA    Dispense:  180 tablet    Refill:  3   hydrochlorothiazide  (MICROZIDE ) 12.5 MG capsule    Sig: TAKE 1 CAPSULE(12.5 MG) BY MOUTH DAILY    Dispense:  90 capsule  Refill:  3      Follow-up: No follow-ups on file.  Anitra Barn, MD

## 2023-09-18 NOTE — Telephone Encounter (Signed)
 Pharmacy Patient Advocate Encounter   Received notification from CoverMyMeds that prior authorization for Tadalafil  5MG  tablets is required/requested.   Insurance verification completed.   The patient is insured through [employer] .   Per test claim: PA required; PA submitted to above mentioned insurance via CoverMyMeds Key/confirmation #/EOC (Key: [id number])   Status is pending

## 2023-09-18 NOTE — Patient Instructions (Addendum)
 Pains and chills can be due to Ozempic  or Crestor  Hold Ozempic  for 1 wk or hold Rosuvastatin  x 1 week

## 2023-09-18 NOTE — Assessment & Plan Note (Signed)
 On Ozempic , Crestor , ASA

## 2023-09-18 NOTE — Assessment & Plan Note (Signed)
 Due to Ozempic  or Crestor  Hold Ozempic  for 1 wk or hold Rosuvastatin  x 1 week

## 2023-09-18 NOTE — Assessment & Plan Note (Signed)
 On Ozempic  Metformin  500 mg bid

## 2023-09-19 ENCOUNTER — Other Ambulatory Visit (HOSPITAL_COMMUNITY): Payer: Self-pay

## 2023-09-19 NOTE — Telephone Encounter (Signed)
 Pharmacy Patient Advocate Encounter  Received notification from [employer] that Prior Authorization for Tadalafil  5MG  tablets has been APPROVED from 05/23/23 to 05/21/24. Ran test claim, Copay is $16.66. This test claim was processed through [facility]- copay amounts may vary at other pharmacies due to pharmacy/plan contracts, or as the patient moves through the different stages of their insurance plan.   PA #/Case ID/Reference #:  [id number]

## 2023-09-27 ENCOUNTER — Encounter: Payer: Self-pay | Admitting: Internal Medicine

## 2023-10-01 ENCOUNTER — Ambulatory Visit: Admitting: Internal Medicine

## 2024-01-07 ENCOUNTER — Encounter: Payer: Self-pay | Admitting: Internal Medicine

## 2024-01-18 ENCOUNTER — Encounter: Payer: Self-pay | Admitting: Family Medicine

## 2024-02-08 ENCOUNTER — Encounter: Payer: Self-pay | Admitting: Internal Medicine

## 2024-02-13 ENCOUNTER — Other Ambulatory Visit: Payer: Self-pay

## 2024-02-13 DIAGNOSIS — E669 Obesity, unspecified: Secondary | ICD-10-CM

## 2024-02-13 MED ORDER — ONETOUCH VERIO VI STRP: ORAL_STRIP | 11 refills | Status: DC

## 2024-02-14 ENCOUNTER — Encounter: Payer: Self-pay | Admitting: Internal Medicine

## 2024-02-15 ENCOUNTER — Other Ambulatory Visit: Payer: Self-pay

## 2024-02-15 DIAGNOSIS — E1169 Type 2 diabetes mellitus with other specified complication: Secondary | ICD-10-CM

## 2024-02-15 MED ORDER — ONETOUCH VERIO VI STRP: ORAL_STRIP | 11 refills | Status: AC

## 2024-03-11 ENCOUNTER — Encounter: Payer: Self-pay | Admitting: Internal Medicine

## 2024-03-18 ENCOUNTER — Encounter: Payer: Self-pay | Admitting: Internal Medicine

## 2024-04-07 ENCOUNTER — Encounter: Payer: Self-pay | Admitting: Family Medicine

## 2024-05-06 ENCOUNTER — Other Ambulatory Visit: Payer: Self-pay

## 2024-05-06 DIAGNOSIS — E119 Type 2 diabetes mellitus without complications: Secondary | ICD-10-CM

## 2024-05-26 LAB — OPHTHALMOLOGY REPORT-SCANNED

## 2024-06-05 ENCOUNTER — Encounter: Payer: Self-pay | Admitting: Internal Medicine

## 2024-06-09 ENCOUNTER — Ambulatory Visit: Payer: Medicare PPO

## 2024-06-09 ENCOUNTER — Encounter: Payer: Self-pay | Admitting: Internal Medicine

## 2024-06-10 ENCOUNTER — Ambulatory Visit

## 2024-06-10 VITALS — BP 134/72 | HR 74 | Ht 67.5 in | Wt 250.0 lb

## 2024-06-10 DIAGNOSIS — Z0000 Encounter for general adult medical examination without abnormal findings: Secondary | ICD-10-CM | POA: Diagnosis not present

## 2024-06-10 DIAGNOSIS — Z1159 Encounter for screening for other viral diseases: Secondary | ICD-10-CM

## 2024-06-10 DIAGNOSIS — E119 Type 2 diabetes mellitus without complications: Secondary | ICD-10-CM

## 2024-06-10 DIAGNOSIS — Z122 Encounter for screening for malignant neoplasm of respiratory organs: Secondary | ICD-10-CM

## 2024-06-10 DIAGNOSIS — F17200 Nicotine dependence, unspecified, uncomplicated: Secondary | ICD-10-CM | POA: Diagnosis not present

## 2024-06-10 NOTE — Progress Notes (Cosign Needed Addendum)
 "  Chief Complaint  Patient presents with   Medicare Wellness     Subjective:   Drew Hansen is a 74 y.o. male who presents for a Medicare Annual Wellness Visit.  Visit info / Clinical Intake: Medicare Wellness Visit Type:: Subsequent Annual Wellness Visit Persons participating in visit and providing information:: patient Medicare Wellness Visit Mode:: In-person (required for WTM) Interpreter Needed?: No Pre-visit prep was completed: yes AWV questionnaire completed by patient prior to visit?: yes Date:: 06/06/24 Living arrangements:: lives with spouse/significant other Patient's Overall Health Status Rating: good Typical amount of pain: some Does pain affect daily life?: no Are you currently prescribed opioids?: no  Dietary Habits and Nutritional Risks How many meals a day?: 3 Eats fruit and vegetables daily?: yes Most meals are obtained by: preparing own meals In the last 2 weeks, have you had any of the following?: none Diabetic:: (!) yes Any non-healing wounds?: no Would you like to be referred to a Nutritionist or for Diabetic Management? : no  Functional Status Activities of Daily Living (to include ambulation/medication): Independent Ambulation: Independent with device- listed below Home Assistive Devices/Equipment: Dentures (specify type); Eyeglasses Medication Administration: Independent Home Management (perform basic housework or laundry): Independent Manage your own finances?: yes Primary transportation is: driving Concerns about vision?: no *vision screening is required for WTM* Concerns about hearing?: no  Fall Screening Falls in the past year?: 0 Number of falls in past year: 0 Was there an injury with Fall?: 0 Fall Risk Category Calculator: 0 Patient Fall Risk Level: Low Fall Risk  Fall Risk Patient at Risk for Falls Due to: No Fall Risks Fall risk Follow up: Falls evaluation completed; Falls prevention discussed  Home and Transportation  Safety: All rugs have non-skid backing?: yes All stairs or steps have railings?: yes Grab bars in the bathtub or shower?: yes Have non-skid surface in bathtub or shower?: yes Good home lighting?: yes Regular seat belt use?: yes Hospital stays in the last year:: no  Cognitive Assessment Difficulty concentrating, remembering, or making decisions? : no Will 6CIT or Mini Cog be Completed: yes What year is it?: 0 points Give patient an address phrase to remember (5 components): [address] [place], [place], [employer] (For [employer]) Does Patient Have a Medical Advance Directive?: No Would patient like information on creating a medical advance directive?: No - Patient declined  Reviewed/Updated  Reviewed/Updated: Reviewed All (Medical, Surgical, Family, Medications, Allergies, Care Teams, Patient Goals)    Allergies (verified) Ciprofloxacin   Current Medications (verified) Outpatient Encounter Medications as of 06/10/2024  Medication Sig   aspirin  81 MG EC tablet Take 1 tablet (81 mg total) by mouth daily. Swallow whole.   b complex vitamins tablet Take 1 tablet by mouth daily.   Blood Glucose Monitoring Suppl (ONETOUCH VERIO) w/Device KIT 1 Units by Does not apply route daily as needed.   fluticasone  (FLONASE ) 50 MCG/ACT nasal spray Place 2 sprays into both nostrils daily.   glucose blood (ONETOUCH VERIO) test strip Use as instructed. 2x a week as needed.   hydrochlorothiazide  (MICROZIDE ) 12.5 MG capsule TAKE 1 CAPSULE(12.5 MG) BY MOUTH DAILY   ibuprofen (ADVIL,MOTRIN) 600 MG tablet Take 600 mg by mouth 2 (two) times daily as needed.     Lancets (ONETOUCH ULTRASOFT) lancets Use as instructed   loratadine  (CLARITIN ) 10 MG tablet Take 1 tablet (10 mg total) by mouth daily.   metFORMIN  (GLUCOPHAGE ) 500 MG tablet TAKE 1 TABLET(500 MG) BY MOUTH TWICE DAILY WITH A MEAL  rosuvastatin  (CRESTOR ) 5 MG tablet TAKE 1 TABLET(5 MG) BY MOUTH DAILY   Semaglutide , 2 MG/DOSE, 8 MG/3ML SOPN  Inject 2 mg as directed once a week.   tadalafil  (CIALIS ) 5 MG tablet Take 1 tablet (5 mg total) by mouth daily.   tamsulosin  (FLOMAX ) 0.4 MG CAPS capsule TAKE 1 CAPSULE(0.4 MG) BY MOUTH DAILY   tiZANidine  (ZANAFLEX ) 2 MG tablet Take 1-2 tablets (2-4 mg total) by mouth every 8 (eight) hours as needed.   furosemide  (LASIX ) 20 MG tablet TAKE 1 TO 2 TABLETS(20 TO 40 MG) BY MOUTH DAILY AS NEEDED FOR EDEMA (Patient not taking: Reported on 06/10/2024)   [DISCONTINUED] cholecalciferol (VITAMIN D ) 1000 units tablet Take 2 tablets (2,000 Units total) by mouth daily.   No facility-administered encounter medications on file as of 06/10/2024.    History: Past Medical History:  Diagnosis Date   BPH (benign prostatic hyperplasia) 2007   Elevated PSA, s/p bx Dr Ceil   Past Surgical History:  Procedure Laterality Date   PROSTATE BIOPSY     Dr Ottelin - he did not go?   Family History  Problem Relation Age of Onset   Cancer Mother        Bone   Diabetes Other    Social History   Occupational History   Occupation: RETIRED/Professor    Employer: [employer]  Tobacco Use   Smoking status: Every Day    Current packs/day: 0.50    Average packs/day: 0.5 packs/day for 52.1 years (26.0 ttl pk-yrs)    Types: Cigarettes    Start date: 05/22/1972   Smokeless tobacco: Never  Vaping Use   Vaping status: Never Used  Substance and Sexual Activity   Alcohol use: Yes    Alcohol/week: 2.0 standard drinks of alcohol    Types: 1 Glasses of wine, 1 Shots of liquor per week    Comment: occas   Drug use: No   Sexual activity: Yes   Tobacco Counseling Ready to quit: Not Answered Counseling given: Yes  SDOH Screenings   Food Insecurity: No Food Insecurity (06/10/2024)  Housing: Unknown (06/10/2024)  Transportation Needs: No Transportation Needs (06/10/2024)  Utilities: Not At Risk (06/10/2024)  Alcohol Screen: Low Risk (06/06/2024)  Depression (PHQ2-9): Low Risk (06/10/2024)  Financial  Resource Strain: Low Risk (06/06/2024)  Physical Activity: Sufficiently Active (06/10/2024)  Social Connections: Moderately Isolated (06/10/2024)  Stress: No Stress Concern Present (06/10/2024)  Tobacco Use: High Risk (06/10/2024)  Health Literacy: Adequate Health Literacy (06/10/2024)   See flowsheets for full screening details  Depression Screen PHQ 2 & 9 Depression Scale- Over the past 2 weeks, how often have you been bothered by any of the following problems? Little interest or pleasure in doing things: 0 Feeling down, depressed, or hopeless (PHQ Adolescent also includes...irritable): 0 PHQ-2 Total Score: 0 Trouble falling or staying asleep, or sleeping too much: 1 (gets 7-8hrs) Feeling tired or having little energy: 0 Poor appetite or overeating (PHQ Adolescent also includes...weight loss): 0 Feeling bad about yourself - or that you are a failure or have let yourself or your family down: 0 Trouble concentrating on things, such as reading the newspaper or watching television (PHQ Adolescent also includes...like school work): 0 Moving or speaking so slowly that other people could have noticed. Or the opposite - being so fidgety or restless that you have been moving around a lot more than usual: 0 Thoughts that you would be better off dead, or of hurting yourself in some way: 0  PHQ-9 Total Score: 1 If you checked off any problems, how difficult have these problems made it for you to do your work, take care of things at home, or get along with other people?: Not difficult at all  Depression Treatment Depression Interventions/Treatment : EYV7-0 Score <4 Follow-up Not Indicated     Goals Addressed               This Visit's Progress     Patient Stated (pt-stated)        Patient stated he plans to continue taking meds and watching sugar intake             Objective:    Today's Vitals   06/10/24 1535  BP: 134/72  Pulse: 74  SpO2: 98%  Weight: 250 lb (113.4 kg)  Height: 5'  7.5 (1.715 m)   Body mass index is 38.58 kg/m.  Hearing/Vision screen Hearing Screening - Comments:: Denies hearing difficulties   Vision Screening - Comments:: Wears eyeglasses for reading- up to date with routine eye exams with Dr Maree Immunizations and Health Maintenance Health Maintenance  Topic Date Due   FOOT EXAM  Never done   Diabetic kidney evaluation - Urine ACR  Never done   Hepatitis C Screening  Never done   Lung Cancer Screening  Never done   COVID-19 Vaccine (3 - 2025-26 season) 01/21/2024   HEMOGLOBIN A1C  03/19/2024   Zoster Vaccines- Shingrix (1 of 2) 09/08/2024 (Originally 05/27/2000)   DTaP/Tdap/Td (2 - Td or Tdap) 06/10/2025 (Originally 10/16/2022)   Fecal DNA (Cologuard)  07/30/2024   Diabetic kidney evaluation - eGFR measurement  09/17/2024   OPHTHALMOLOGY EXAM  05/26/2025   Medicare Annual Wellness (AWV)  06/10/2025   Pneumococcal Vaccine: 50+ Years  Completed   Meningococcal B Vaccine  Aged Out   Influenza Vaccine  Discontinued        Assessment/Plan:  This is a routine wellness examination for [place].  Hepatitis C Screening: ordered today Hemoglobin A1C - ordered today Diabetic Kidney Urine - ordered today Lung Cancer Screening - ordered today  I have recommended that this patient have a immunization for Influenza, Shingles, and Tdap  but he declines at this time. I have discussed the risks and benefits of this procedure with him. The patient verbalizes understanding.   Patient Care Team: Plotnikov, Karlynn GAILS, MD as PCP - General Meroth, Philippe RAMAN, AUD as Consulting Physician (Audiology) Maree Lonni Inks, MD as Consulting Physician (Ophthalmology)  I have personally reviewed and noted the following in the patients chart:   Medical and social history Use of alcohol, tobacco or illicit drugs  Current medications and supplements including opioid prescriptions. Functional ability and status Nutritional status Physical activity Advanced  directives List of other physicians Hospitalizations, surgeries, and ER visits in previous 12 months Vitals Screenings to include cognitive, depression, and falls Referrals and appointments  Orders Placed This Encounter  Procedures   Hepatitis C antibody    Standing Status:   Future    Expiration Date:   06/10/2025   Microalbumin / creatinine urine ratio    Standing Status:   Future    Expiration Date:   06/10/2025   Hemoglobin A1c    Standing Status:   Future    Expiration Date:   06/10/2025    Release to patient:   Immediate [1]   Ambulatory Referral for Lung Cancer Scre    Referral Priority:   Routine    Referral Type:   Consultation  Referral Reason:   Specialty Services Required    Referred to Provider:   Ruthell Lauraine FALCON, NP    Number of Visits Requested:   1   In addition, I have reviewed and discussed with patient certain preventive protocols, quality metrics, and best practice recommendations. A written personalized care plan for preventive services as well as general preventive health recommendations were provided to patient.   Verdie CHRISTELLA Saba, CMA   06/10/2024   Return in 1 year (on 06/10/2025).  After Visit Summary: (MyChart) Due to this being a telephonic visit, the after visit summary with patients personalized plan was offered to patient via MyChart   Nurse Notes: Diabetes f/u appt w/PCP on 06/25/2024; scheduled 2027 AWV appt  Medical screening examination/treatment/procedure(s) were performed by non-physician practitioner and as supervising physician I was immediately available for consultation/collaboration.  I agree with above. Karlynn Noel, MD "

## 2024-06-10 NOTE — Patient Instructions (Signed)
 Drew Hansen,  Thank you for taking the time for your Medicare Wellness Visit. I appreciate your continued commitment to your health goals. Please review the care plan we discussed, and feel free to reach out if I can assist you further.  Please note that Annual Wellness Visits do not include a physical exam. Some assessments may be limited, especially if the visit was conducted virtually. If needed, we may recommend an in-person follow-up with your provider.  Ongoing Care Seeing your primary care provider every 3 to 6 months helps us  monitor your health and provide consistent, personalized care.   Referrals If a referral was made during today's visit and you haven't received any updates within two weeks, please contact the referred provider directly to check on the status.  Recommended Screenings:  Health Maintenance  Topic Date Due   Complete foot exam   Never done   Kidney health urinalysis for diabetes  Never done   Hepatitis C Screening  Never done   Zoster (Shingles) Vaccine (1 of 2) Never done   DTaP/Tdap/Td vaccine (2 - Td or Tdap) 10/16/2022   COVID-19 Vaccine (3 - 2025-26 season) 01/21/2024   Hemoglobin A1C  03/19/2024   Cologuard (Stool DNA test)  07/30/2024   Yearly kidney function blood test for diabetes  09/17/2024   Eye exam for diabetics  05/26/2025   Medicare Annual Wellness Visit  06/10/2025   Pneumococcal Vaccine for age over 11  Completed   Meningitis B Vaccine  Aged Out   Flu Shot  Discontinued       06/06/2023    2:14 PM  Advanced Directives  Does Patient Have a Medical Advance Directive? No  Would patient like information on creating a medical advance directive? No - Patient declined    Vision: Annual vision screenings are recommended for early detection of glaucoma, cataracts, and diabetic retinopathy. These exams can also reveal signs of chronic conditions such as diabetes and high blood pressure.  Dental: Annual dental screenings help detect early  signs of oral cancer, gum disease, and other conditions linked to overall health, including heart disease and diabetes.  P

## 2024-06-18 ENCOUNTER — Emergency Department (HOSPITAL_COMMUNITY)

## 2024-06-18 ENCOUNTER — Other Ambulatory Visit

## 2024-06-18 ENCOUNTER — Encounter (HOSPITAL_COMMUNITY): Payer: Self-pay

## 2024-06-18 ENCOUNTER — Other Ambulatory Visit: Payer: Self-pay

## 2024-06-18 ENCOUNTER — Emergency Department (HOSPITAL_COMMUNITY)
Admission: EM | Admit: 2024-06-18 | Discharge: 2024-06-18 | Disposition: A | Discharge status: Home / Self Care | Attending: Emergency Medicine | Admitting: Emergency Medicine

## 2024-06-18 DIAGNOSIS — Z720 Tobacco use: Secondary | ICD-10-CM | POA: Insufficient documentation

## 2024-06-18 DIAGNOSIS — S199XXA Unspecified injury of neck, initial encounter: Secondary | ICD-10-CM | POA: Diagnosis present

## 2024-06-18 DIAGNOSIS — Z7982 Long term (current) use of aspirin: Secondary | ICD-10-CM | POA: Diagnosis not present

## 2024-06-18 DIAGNOSIS — J449 Chronic obstructive pulmonary disease, unspecified: Secondary | ICD-10-CM | POA: Insufficient documentation

## 2024-06-18 DIAGNOSIS — E119 Type 2 diabetes mellitus without complications: Secondary | ICD-10-CM

## 2024-06-18 DIAGNOSIS — R0789 Other chest pain: Secondary | ICD-10-CM | POA: Diagnosis not present

## 2024-06-18 DIAGNOSIS — Z7984 Long term (current) use of oral hypoglycemic drugs: Secondary | ICD-10-CM | POA: Insufficient documentation

## 2024-06-18 DIAGNOSIS — Z1159 Encounter for screening for other viral diseases: Secondary | ICD-10-CM

## 2024-06-18 DIAGNOSIS — S161XXA Strain of muscle, fascia and tendon at neck level, initial encounter: Secondary | ICD-10-CM | POA: Insufficient documentation

## 2024-06-18 DIAGNOSIS — Y92410 Unspecified street and highway as the place of occurrence of the external cause: Secondary | ICD-10-CM | POA: Diagnosis not present

## 2024-06-18 LAB — MICROALBUMIN / CREATININE URINE RATIO
Creatinine,U: 160.1 mg/dL
Microalb Creat Ratio: 34.8 mg/g — ABNORMAL HIGH (ref 0.0–30.0)
Microalb, Ur: 5.6 mg/dL — ABNORMAL HIGH (ref 0.7–1.9)

## 2024-06-18 LAB — I-STAT CHEM 8, ED
BUN: 11 mg/dL (ref 8–23)
Calcium, Ion: 1.25 mmol/L (ref 1.15–1.40)
Chloride: 100 mmol/L (ref 98–111)
Creatinine, Ser: 1 mg/dL (ref 0.61–1.24)
Glucose, Bld: 138 mg/dL — ABNORMAL HIGH (ref 70–99)
HCT: 47 % (ref 39.0–52.0)
Hemoglobin: 16 g/dL (ref 13.0–17.0)
Potassium: 4.2 mmol/L (ref 3.5–5.1)
Sodium: 142 mmol/L (ref 135–145)
TCO2: 27 mmol/L (ref 22–32)

## 2024-06-18 LAB — HEPATITIS C ANTIBODY: Hepatitis C Ab: NONREACTIVE

## 2024-06-18 LAB — HEMOGLOBIN A1C: Hgb A1c MFr Bld: 5.8 % (ref 4.6–6.5)

## 2024-06-18 MED ORDER — IOHEXOL 350 MG/ML SOLN
100.0000 mL | Freq: Once | INTRAVENOUS | Status: AC | PRN
  Administered 2024-06-18: 100 mL via INTRAVENOUS

## 2024-06-18 MED ORDER — METHOCARBAMOL 500 MG PO TABS: 500.0000 mg | ORAL_TABLET | Freq: Two times a day (BID) | ORAL | 0 refills | Status: AC

## 2024-06-18 MED ORDER — LIDOCAINE 5 % EX PTCH: 1.0000 | MEDICATED_PATCH | CUTANEOUS | 0 refills | Status: AC
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
## 2024-06-23 ENCOUNTER — Ambulatory Visit: Payer: Self-pay | Admitting: Internal Medicine

## 2024-06-25 ENCOUNTER — Ambulatory Visit: Admitting: Internal Medicine

## 2024-06-25 ENCOUNTER — Encounter: Payer: Self-pay | Admitting: Internal Medicine

## 2024-06-25 DIAGNOSIS — I10 Essential (primary) hypertension: Secondary | ICD-10-CM

## 2024-06-25 DIAGNOSIS — H01009 Unspecified blepharitis unspecified eye, unspecified eyelid: Secondary | ICD-10-CM | POA: Insufficient documentation

## 2024-06-25 DIAGNOSIS — H01002 Unspecified blepharitis right lower eyelid: Secondary | ICD-10-CM

## 2024-06-25 DIAGNOSIS — R972 Elevated prostate specific antigen [PSA]: Secondary | ICD-10-CM

## 2024-06-25 DIAGNOSIS — R739 Hyperglycemia, unspecified: Secondary | ICD-10-CM

## 2024-06-25 DIAGNOSIS — G629 Polyneuropathy, unspecified: Secondary | ICD-10-CM

## 2024-06-25 DIAGNOSIS — I2510 Atherosclerotic heart disease of native coronary artery without angina pectoris: Secondary | ICD-10-CM

## 2024-06-25 DIAGNOSIS — R162 Hepatomegaly with splenomegaly, not elsewhere classified: Secondary | ICD-10-CM | POA: Insufficient documentation

## 2024-06-25 MED ORDER — ERYTHROMYCIN 5 MG/GM OP OINT: 1.0000 | TOPICAL_OINTMENT | Freq: Every day | OPHTHALMIC | 3 refills | Status: AC

## 2024-06-25 MED ORDER — PRAVASTATIN SODIUM 20 MG PO TABS: 20.0000 mg | ORAL_TABLET | Freq: Every day | ORAL | 11 refills | Status: AC

## 2024-06-25 NOTE — Assessment & Plan Note (Signed)
Treat edema Furosemide daily prn
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
## 2024-06-25 NOTE — Assessment & Plan Note (Signed)
 Erythro oint qhs

## 2024-06-25 NOTE — Assessment & Plan Note (Signed)
 Labs

## 2024-06-25 NOTE — Assessment & Plan Note (Signed)
Cont on HCTZ 

## 2024-06-25 NOTE — Assessment & Plan Note (Signed)
 New on CT Likely fatty liver - will monitor

## 2024-06-25 NOTE — Assessment & Plan Note (Signed)
Monitoring PSA 

## 2024-06-25 NOTE — Patient Instructions (Signed)

## 2024-06-25 NOTE — Assessment & Plan Note (Signed)
 On Ozempic  Metformin  500 mg bid

## 2024-06-25 NOTE — Assessment & Plan Note (Signed)
 D/c Crestor  Trial of Pravastatin 
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
## 2025-06-15 ENCOUNTER — Ambulatory Visit
